# Patient Record
Sex: Male | Born: 2000 | Race: Black or African American | Hispanic: No | Marital: Single | Smoking: Never smoker
Health system: Southern US, Community
[De-identification: ages and names within clinical notes are randomized; demographics above are authoritative.]

## PROBLEM LIST (undated history)

## (undated) DIAGNOSIS — F32A Depression, unspecified: Secondary | ICD-10-CM

## (undated) DIAGNOSIS — F319 Bipolar disorder, unspecified: Secondary | ICD-10-CM

## (undated) DIAGNOSIS — F419 Anxiety disorder, unspecified: Secondary | ICD-10-CM

## (undated) DIAGNOSIS — F329 Major depressive disorder, single episode, unspecified: Secondary | ICD-10-CM

## (undated) DIAGNOSIS — M419 Scoliosis, unspecified: Secondary | ICD-10-CM

## (undated) DIAGNOSIS — F4310 Post-traumatic stress disorder, unspecified: Secondary | ICD-10-CM

## (undated) DIAGNOSIS — G43909 Migraine, unspecified, not intractable, without status migrainosus: Secondary | ICD-10-CM

## (undated) DIAGNOSIS — J45909 Unspecified asthma, uncomplicated: Secondary | ICD-10-CM

## (undated) DIAGNOSIS — R569 Unspecified convulsions: Secondary | ICD-10-CM

## (undated) HISTORY — PX: BACK SURGERY: SHX140

## (undated) HISTORY — PX: OTHER SURGICAL HISTORY: SHX169

---

## 2018-11-09 ENCOUNTER — Emergency Department (HOSPITAL_COMMUNITY)
Admission: EM | Admit: 2018-11-09 | Discharge: 2018-11-10 | Disposition: A | Discharge status: Other Acute Inpatient Hospital | Payer: Self-pay | Attending: Emergency Medicine | Admitting: Emergency Medicine

## 2018-11-09 ENCOUNTER — Encounter (HOSPITAL_COMMUNITY): Payer: Self-pay

## 2018-11-09 ENCOUNTER — Other Ambulatory Visit: Payer: Self-pay

## 2018-11-09 DIAGNOSIS — R45850 Homicidal ideations: Secondary | ICD-10-CM | POA: Insufficient documentation

## 2018-11-09 DIAGNOSIS — F329 Major depressive disorder, single episode, unspecified: Secondary | ICD-10-CM | POA: Insufficient documentation

## 2018-11-09 DIAGNOSIS — R45851 Suicidal ideations: Secondary | ICD-10-CM | POA: Insufficient documentation

## 2018-11-09 DIAGNOSIS — F17210 Nicotine dependence, cigarettes, uncomplicated: Secondary | ICD-10-CM | POA: Insufficient documentation

## 2018-11-09 HISTORY — DX: Anxiety disorder, unspecified: F41.9

## 2018-11-09 HISTORY — DX: Unspecified asthma, uncomplicated: J45.909

## 2018-11-09 HISTORY — DX: Depression, unspecified: F32.A

## 2018-11-09 HISTORY — DX: Major depressive disorder, single episode, unspecified: F32.9

## 2018-11-09 LAB — COMPREHENSIVE METABOLIC PANEL
ALT: 16 U/L (ref 0–44)
AST: 25 U/L (ref 15–41)
Albumin: 3.9 g/dL (ref 3.5–5.0)
Alkaline Phosphatase: 84 U/L (ref 38–126)
Anion gap: 8 (ref 5–15)
BUN: 11 mg/dL (ref 6–20)
CO2: 27 mmol/L (ref 22–32)
CREATININE: 0.87 mg/dL (ref 0.61–1.24)
Calcium: 9.6 mg/dL (ref 8.9–10.3)
Chloride: 104 mmol/L (ref 98–111)
GFR calc Af Amer: >60 mL/min (ref >60)
GFR calc non Af Amer: >60 mL/min (ref >60)
Glucose, Bld: 91 mg/dL (ref 70–99)
Potassium: 3.6 mmol/L (ref 3.5–5.1)
Sodium: 139 mmol/L (ref 135–145)
Total Bilirubin: 0.4 mg/dL (ref 0.3–1.2)
Total Protein: 8.2 g/dL — ABNORMAL HIGH (ref 6.5–8.1)

## 2018-11-09 LAB — CBC
HCT: 43.1 % (ref 39.0–52.0)
Hemoglobin: 13.8 g/dL (ref 13.0–17.0)
MCH: 28 pg (ref 26.0–34.0)
MCHC: 32 g/dL (ref 30.0–36.0)
MCV: 87.4 fL (ref 80.0–100.0)
Platelets: 310 10*3/uL (ref 150–400)
RBC: 4.93 MIL/uL (ref 4.22–5.81)
RDW: 13.9 % (ref 11.5–15.5)
WBC: 8.1 10*3/uL (ref 4.0–10.5)
nRBC: 0 % (ref 0.0–0.2)

## 2018-11-09 LAB — ACETAMINOPHEN LEVEL: Acetaminophen (Tylenol), Serum: <10 ug/mL — ABNORMAL LOW (ref 10–30)

## 2018-11-09 LAB — SALICYLATE LEVEL: Salicylate Lvl: <7 mg/dL (ref 2.8–30.0)

## 2018-11-09 LAB — ETHANOL: Alcohol, Ethyl (B): <10 mg/dL (ref <10)
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
## 2018-11-10 ENCOUNTER — Other Ambulatory Visit: Payer: Self-pay

## 2018-11-10 ENCOUNTER — Inpatient Hospital Stay (HOSPITAL_COMMUNITY)
Admission: AD | Admit: 2018-11-10 | Discharge: 2018-11-12 | DRG: 885 | Disposition: A | Discharge status: Home / Self Care | Payer: Federal, State, Local not specified - Other | Source: Intra-hospital | Attending: Psychiatry | Admitting: Psychiatry

## 2018-11-10 ENCOUNTER — Encounter (HOSPITAL_COMMUNITY): Payer: Self-pay | Admitting: Emergency Medicine

## 2018-11-10 DIAGNOSIS — Z590 Homelessness: Secondary | ICD-10-CM

## 2018-11-10 DIAGNOSIS — F4310 Post-traumatic stress disorder, unspecified: Secondary | ICD-10-CM | POA: Diagnosis present

## 2018-11-10 DIAGNOSIS — R45851 Suicidal ideations: Secondary | ICD-10-CM | POA: Diagnosis present

## 2018-11-10 DIAGNOSIS — F1290 Cannabis use, unspecified, uncomplicated: Secondary | ICD-10-CM | POA: Diagnosis present

## 2018-11-10 DIAGNOSIS — Z79899 Other long term (current) drug therapy: Secondary | ICD-10-CM | POA: Diagnosis not present

## 2018-11-10 DIAGNOSIS — F649 Gender identity disorder, unspecified: Secondary | ICD-10-CM | POA: Diagnosis present

## 2018-11-10 DIAGNOSIS — F1590 Other stimulant use, unspecified, uncomplicated: Secondary | ICD-10-CM | POA: Diagnosis present

## 2018-11-10 DIAGNOSIS — Z915 Personal history of self-harm: Secondary | ICD-10-CM

## 2018-11-10 DIAGNOSIS — F332 Major depressive disorder, recurrent severe without psychotic features: Principal | ICD-10-CM | POA: Diagnosis present

## 2018-11-10 DIAGNOSIS — F603 Borderline personality disorder: Secondary | ICD-10-CM | POA: Diagnosis present

## 2018-11-10 DIAGNOSIS — G4700 Insomnia, unspecified: Secondary | ICD-10-CM | POA: Diagnosis present

## 2018-11-10 DIAGNOSIS — F17210 Nicotine dependence, cigarettes, uncomplicated: Secondary | ICD-10-CM | POA: Diagnosis present

## 2018-11-10 LAB — RAPID URINE DRUG SCREEN, HOSP PERFORMED
Amphetamines: NOT DETECTED
Barbiturates: NOT DETECTED
Benzodiazepines: NOT DETECTED
Cocaine: NOT DETECTED
Opiates: NOT DETECTED
Tetrahydrocannabinol: NOT DETECTED

## 2018-11-10 MED ORDER — VITAMIN B-1 100 MG PO TABS: 100.0000 mg | ORAL_TABLET | Freq: Every day | ORAL | Status: DC

## 2018-11-10 MED ORDER — ESTRADIOL 1 MG PO TABS
2.0000 mg | ORAL_TABLET | Freq: Three times a day (TID) | ORAL | Status: DC
  Administered 2018-11-10 – 2018-11-12 (×7): 2 mg via ORAL
  Filled 2018-11-10 (×5): qty 2
  Filled 2018-11-10: qty 30
  Filled 2018-11-10: qty 2
  Filled 2018-11-10 (×2): qty 30
  Filled 2018-11-10 (×6): qty 2

## 2018-11-10 MED ORDER — LOPERAMIDE HCL 2 MG PO CAPS: 2.0000 mg | ORAL_CAPSULE | ORAL | Status: DC | PRN

## 2018-11-10 MED ORDER — ARIPIPRAZOLE 10 MG PO TABS
10.0000 mg | ORAL_TABLET | Freq: Every day | ORAL | Status: DC
  Administered 2018-11-11: 10 mg via ORAL
  Filled 2018-11-10 (×3): qty 1
  Filled 2018-11-10: qty 7

## 2018-11-10 MED ORDER — NICOTINE 21 MG/24HR TD PT24: 21.0000 mg | MEDICATED_PATCH | Freq: Every day | TRANSDERMAL | Status: DC

## 2018-11-10 MED ORDER — ACETAMINOPHEN 325 MG PO TABS
650.0000 mg | ORAL_TABLET | Freq: Four times a day (QID) | ORAL | Status: DC | PRN
  Administered 2018-11-10: 650 mg via ORAL
  Filled 2018-11-10: qty 2

## 2018-11-10 MED ORDER — SPIRONOLACTONE 12.5 MG HALF TABLET
12.5000 mg | ORAL_TABLET | Freq: Two times a day (BID) | ORAL | Status: DC
  Filled 2018-11-10 (×2): qty 1

## 2018-11-10 MED ORDER — THIAMINE HCL 100 MG/ML IJ SOLN: 100.0000 mg | Freq: Every day | INTRAMUSCULAR | Status: DC

## 2018-11-10 MED ORDER — MAGNESIUM HYDROXIDE 400 MG/5ML PO SUSP: 30.0000 mL | Freq: Every day | ORAL | Status: DC | PRN

## 2018-11-10 MED ORDER — SPIRONOLACTONE 100 MG PO TABS
50.0000 mg | ORAL_TABLET | Freq: Two times a day (BID) | ORAL | Status: DC
  Administered 2018-11-10 – 2018-11-12 (×4): 50 mg via ORAL
  Filled 2018-11-10 (×6): qty 2
  Filled 2018-11-10: qty 7
  Filled 2018-11-10: qty 2
  Filled 2018-11-10: qty 7

## 2018-11-10 MED ORDER — HYDROXYZINE HCL 25 MG PO TABS
25.0000 mg | ORAL_TABLET | Freq: Three times a day (TID) | ORAL | Status: DC | PRN
  Administered 2018-11-10 – 2018-11-11 (×2): 25 mg via ORAL
  Filled 2018-11-10: qty 10
  Filled 2018-11-10: qty 1

## 2018-11-10 MED ORDER — TRAZODONE HCL 50 MG PO TABS
50.0000 mg | ORAL_TABLET | Freq: Every evening | ORAL | Status: DC | PRN
  Administered 2018-11-11: 50 mg via ORAL
  Filled 2018-11-10: qty 1
  Filled 2018-11-10: qty 7

## 2018-11-10 MED ORDER — LORAZEPAM 1 MG PO TABS: 0.0000 mg | ORAL_TABLET | Freq: Two times a day (BID) | ORAL | Status: DC

## 2018-11-10 MED ORDER — ACETAMINOPHEN 325 MG PO TABS: 650.0000 mg | ORAL_TABLET | ORAL | Status: DC | PRN

## 2018-11-10 MED ORDER — LORAZEPAM 1 MG PO TABS: 0.0000 mg | ORAL_TABLET | Freq: Four times a day (QID) | ORAL | Status: DC

## 2018-11-10 MED ORDER — ADULT MULTIVITAMIN W/MINERALS CH
1.0000 | ORAL_TABLET | Freq: Every day | ORAL | Status: DC
  Administered 2018-11-10: 1 via ORAL
  Filled 2018-11-10: qty 7
  Filled 2018-11-10 (×4): qty 1

## 2018-11-10 MED ORDER — LORAZEPAM 2 MG/ML IJ SOLN: 0.0000 mg | Freq: Two times a day (BID) | INTRAMUSCULAR | Status: DC

## 2018-11-10 MED ORDER — ONDANSETRON 4 MG PO TBDP: 4.0000 mg | ORAL_TABLET | Freq: Four times a day (QID) | ORAL | Status: DC | PRN

## 2018-11-10 MED ORDER — HYDROXYZINE HCL 25 MG PO TABS
25.0000 mg | ORAL_TABLET | Freq: Four times a day (QID) | ORAL | Status: DC | PRN
  Filled 2018-11-10: qty 1

## 2018-11-10 MED ORDER — ARIPIPRAZOLE 5 MG PO TABS
5.0000 mg | ORAL_TABLET | Freq: Once | ORAL | Status: AC
  Administered 2018-11-10: 5 mg via ORAL
  Filled 2018-11-10 (×2): qty 1

## 2018-11-10 MED ORDER — SERTRALINE HCL 50 MG PO TABS
50.0000 mg | ORAL_TABLET | Freq: Every day | ORAL | Status: DC
  Administered 2018-11-10 – 2018-11-12 (×3): 50 mg via ORAL
  Filled 2018-11-10 (×4): qty 1
  Filled 2018-11-10: qty 7
  Filled 2018-11-10: qty 1

## 2018-11-10 MED ORDER — LORAZEPAM 2 MG/ML IJ SOLN: 0.0000 mg | Freq: Four times a day (QID) | INTRAMUSCULAR | Status: DC

## 2018-11-10 MED ORDER — ZOLPIDEM TARTRATE 5 MG PO TABS: 5.0000 mg | ORAL_TABLET | Freq: Every evening | ORAL | Status: DC | PRN

## 2018-11-10 MED ORDER — ALUM & MAG HYDROXIDE-SIMETH 200-200-20 MG/5ML PO SUSP: 30.0000 mL | ORAL | Status: DC | PRN

## 2018-11-10 MED ORDER — LORAZEPAM 1 MG PO TABS: 1.0000 mg | ORAL_TABLET | Freq: Four times a day (QID) | ORAL | Status: DC | PRN

## 2018-11-10 MED ORDER — ESTRADIOL 1 MG PO TABS
1.0000 mg | ORAL_TABLET | Freq: Two times a day (BID) | ORAL | Status: DC
  Filled 2018-11-10 (×2): qty 1

## 2018-11-10 NOTE — [facility] Group Notes (Signed)
LCSW Group Therapy Note  11/10/2018    10:00-11:00am   Type of Therapy and Topic:  Group Therapy: Shame and Its Impact   Participation Level:  Active   Description of Group:   In this group, patients shared and discussed that guilt is the negative feeling we have when we've done something wrong, while shame is the negative feeling we have simply about "being."  In listening to each other share, patients learned that humans are all imperfect and that there is no shame in this.  We discussed how it could positively impact our wellbeing by accepting our faults as part of our being that can be worked on but does not have to shame [place].    Therapeutic Goals: 1. Patients will learn the difference between guilt and shame. 2. Patients will share their current shame feelings and how this has impacted their current lives. 3. Patients will explore possible ways to think differently about those parts of their bodies, feelings, and lives about which they do have shame. 4. Patients will learn that shame is universal, and that keeping our shame a secret actually increases its hold on [place].  Summary of Patient Progress:  The patient shared that she feels shame about not being able to make other people no matter how hard she tries or what she does.  This is of concern because in truth she realizes she needs to work on making herself happy and will never succeed with her current focus on others.  Therapeutic Modalities:   Cognitive Behavioral Therapy Motivation Interviewing  Lynnell Chad  .
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
## 2018-11-10 NOTE — ED Notes (Signed)
Pt moved to [facility] room. TTS device at bedside, assessment underway.

## 2018-11-10 NOTE — Progress Notes (Signed)
Patient did not attend AA group meeting. 

## 2018-11-10 NOTE — Tx Team (Signed)
Initial Treatment Plan 11/10/2018 3:06 PM Zachary Mcclain FMB:[id number]    PATIENT STRESSORS: Loss of significant relationship Medication change or noncompliance   PATIENT STRENGTHS: Average or above average intelligence Communication skills Motivation for treatment/growth Supportive family/friends   PATIENT IDENTIFIED PROBLEMS: "Money"  "Everything - Life"  "Anxiety"  "self-esteem"  "self-care"             DISCHARGE CRITERIA:  Improved stabilization in mood, thinking, and/or behavior Need for constant or close observation no longer present Reduction of life-threatening or endangering symptoms to within safe limits Verbal commitment to aftercare and medication compliance  PRELIMINARY DISCHARGE PLAN: Outpatient therapy Return to previous living arrangement Return to previous work or school arrangements  PATIENT/FAMILY INVOLVEMENT: This treatment plan has been presented to and reviewed with the patient, Zachary Mcclain, and/or family member.  The patient and family have been given the opportunity to ask questions and make suggestions.  Tania Ade, RN 11/10/2018, 3:06 PM

## 2018-11-10 NOTE — ED Notes (Signed)
Pt received breakfast tray 

## 2018-11-10 NOTE — Progress Notes (Addendum)
Pt tentatively accepted to [facility]  pending UDS to Dr. Jola Babinski, MD. Call to report [phone].   Princess Bruins, MSW, LCSW Therapeutic Triage Specialist  [phone]

## 2018-11-10 NOTE — [facility] Group Notes (Signed)
[facility] Group Notes:  (Nursing)  Date:  11/10/2018  Time:  1430 Type of Therapy:  Nurse Education  Participation Level:  Did Not Attend   Shela Nevin 11/10/2018, 4:06 PM

## 2018-11-10 NOTE — [facility] Suicide Risk Assessment (Signed)
[facility] Admission Suicide Risk Assessment   Nursing information obtained from:    Demographic factors:    Current Mental Status:    Loss Factors:    Historical Factors:    Risk Reduction Factors:     Total Time spent with patient: 30 minutes Principal Problem: Severe recurrent major depression without psychotic features (HCC) Diagnosis:  Principal Problem:   Severe recurrent major depression without psychotic features (HCC) Active Problems:   Borderline personality disorder (HCC)  Subjective Data: Patient is seen and examined.  Patient is a 18 year old male to male transgender patient who presented to the [facility] emergency room on 11/10/2018 with suicidal ideation.  The patient had apparently moved from [place] approximately a week ago to stay with her brother and also to meet her Internet boyfriend and person.  [place] and the turn of events led to the boyfriend not really inviting her to stay with him, her going with a friend to their home, and then developing suicidal ideation with the intent of overdosing on bleach.  The patient has a past psychiatric history significant for cannabis, methamphetamine and alcohol use disorders.  She has several hospitalizations in .  Her last hospitalization was in [place], and was discharged on Zoloft.  She stated that she is supposed be taking Zoloft as well as Abilify.  She stated that she had been off these medications for approximately 5 months.  She also stated that she was in transition with some of her health care issues and had not been on her hormones or testosterone blockers for several months.  Her drug screen on admission was essentially negative, and her blood alcohol was negative.  She stated she had used alcohol over a week ago, and methamphetamines approximately a week ago.  From review of the electronic medical record most of her hospitalizations were centering around substances.  She  did admit that she had a history of being bullied, loneliness and difficulty with confined spaces.  She also has been previously diagnosed with bipolar disorder and borderline personality disorder and has been treated with Seroquel and other medications.  She was admitted to the hospital for evaluation and stabilization.  Continued Clinical Symptoms:    The "Alcohol Use Disorders Identification Test", Guidelines for Use in Primary Care, Second Edition.  World Science writer [facility]). Score between 0-7:  no or low risk or alcohol related problems. Score between 8-15:  moderate risk of alcohol related problems. Score between 16-19:  high risk of alcohol related problems. Score 20 or above:  warrants further diagnostic evaluation for alcohol dependence and treatment.   CLINICAL FACTORS:   Bipolar Disorder:   Depressive phase Depression:   Aggression Anhedonia Comorbid alcohol abuse/dependence Hopelessness Impulsivity Insomnia Alcohol/Substance Abuse/Dependencies Personality Disorders:   Cluster B More than one psychiatric diagnosis Unstable or Poor Therapeutic Relationship Previous Psychiatric Diagnoses and Treatments   Musculoskeletal: Strength & Muscle Tone: within normal limits Gait & Station: normal Patient leans: N/A  Psychiatric Specialty Exam: Physical Exam  Nursing note and vitals reviewed. Constitutional: She is oriented to person, place, and time. She appears well-developed and well-nourished.  HENT:  Head: Normocephalic and atraumatic.  Respiratory: Effort normal.  Neurological: She is alert and oriented to person, place, and time.    ROS  There were no vitals taken for this visit.There is no height or weight on file to calculate BMI.  General Appearance: Casual  Eye Contact:  Good  Speech:  Pressured  Volume:  Normal  Mood:  Anxious  Affect:  Congruent  Thought Process:  Coherent and Descriptions of Associations: Tangential  Orientation:  Full (Time, Place,  and Person)  Thought Content:  Logical  Suicidal Thoughts:  No  Homicidal Thoughts:  No  Memory:  Immediate;   Fair Recent;   Fair Remote;   Fair  Judgement:  Intact  Insight:  Lacking  Psychomotor Activity:  Increased  Concentration:  Concentration: Fair and Attention Span: Fair  Recall:  [employer] of Knowledge:  Fair  Language:  Fair  Akathisia:  Negative  Handed:  Right  AIMS (if indicated):     Assets:  Communication Skills Desire for Improvement Physical Health Resilience  ADL's:  Intact  Cognition:  WNL  Sleep:         COGNITIVE FEATURES THAT CONTRIBUTE TO RISK:  None    SUICIDE RISK:   Minimal: No identifiable suicidal ideation.  Patients presenting with no risk factors but with morbid ruminations; Delahunty be classified as minimal risk based on the severity of the depressive symptoms  PLAN OF CARE: Patient is seen and examined.  Patient is a 18 year old male to male transgender person who presented to the emergency department with suicidal ideation after difficult interactions with her boyfriend who lives in [place].  She had transported from [place] to be with him on Valentine's Day.  This did not work out.  She was in the home of "a friend" and then attempted to drink Clorox.  They asked her to leave and took her to the emergency room because the chaos.  She was admitted to the hospital for evaluation and stabilization.  She will be admitted to the hospital.  Should be integrated into the milieu.  She will be encouraged to attend groups.  She will be restarted on the Abilify, but has been off it for 5 months so we will start at 10 mg p.o. nightly.  We will also restart the Zoloft at 50 mg p.o. daily.  She is also been off her hormones and testosterone blockers.  She will be restarted on her estradiol, but because of her low weight I am going to restart her spironolactone at a lower dose to make sure we do not lead to any dehydration.  Her drug screen on admission  was negative, her blood alcohol on admission was negative.  I certify that inpatient services furnished can reasonably be expected to improve the patient's condition.   Antonieta Pert, MD 11/10/2018, 11:57 AM

## 2018-11-10 NOTE — ED Notes (Signed)
Per [place], [facility], pt has been accepted to [facility].

## 2018-11-10 NOTE — Progress Notes (Signed)
Pt meets criteria for inpt treatment. [facility] to review for possible admission. EDP Pollina, Canary Brim, MD and pt's nurse Si Raider, RN have been advised of the disposition.  Princess Bruins, MSW, LCSW Therapeutic Triage Specialist  [phone]

## 2018-11-10 NOTE — Progress Notes (Signed)
Pt is transgender Male to male.  Pt wearing long wig and is in bed.  Pt prefers to be called Zachary Mcclain.  Pt denies SI, HI and AVH and verbally contracts for safety.  Pt does not come to med window for meds after being notified that they are due.  Pt denies pain or discomfort. Pt given support and encouragement. Pt remains safe on unit.

## 2018-11-10 NOTE — ED Notes (Signed)
Patient being transferred to [facility]

## 2018-11-10 NOTE — Progress Notes (Signed)
Patient ID: Georgeanna Lea Haston, adult   DOB: Jan 21, 2001, 18 y.o.   MRN: [id number]  D: Pt alert and oriented during [facility] admission process. Pt denies SI/HI, A/VH, and any pain. Pt is cooperative.  "Kartel Domingo Pulse Bocchino is an 18 y.o. male who presents to the ED voluntarily. Pt is transgender, male to male. Pt states she moved here from [place] about 1 week ago to live with her brother. Pt states her brother is having conflict in his home, therefore she has been staying with a friend until she is able to move with her brother. Pt states while she was with her friend, she received a phone call from her ex-boyfriend that triggered her. Pt states they began to argue and she became upset. Pt states she attempted to grab a bottle of bleach from under the sink at her friends home however they stopped her and told her that she was scaring the kids in the home and she had to leave. Pt states she felt hopeless and came to the ED. Pt states she has been doing research on ways to kill herself and what would happen to her body if she drank bleach."   A: Education, support, reassurance, and encouragement provided, q15 minute safety checks initiated. Pt's belongings in locker # 25.    R: Pt denies any concerns at this time, and verbally contracts for safety. Pt ambulating on the unit with no issues. Pt remains safe on and off the unit.
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
## 2018-11-10 NOTE — ED Notes (Signed)
Signature pad unavailable.  

## 2018-11-10 NOTE — ED Notes (Signed)
Belongings in 9 and 10 in purple pod-3 bags

## 2018-11-10 NOTE — ED Notes (Signed)
Pt. Resting comfortably. 

## 2018-11-11 LAB — TSH: TSH: 0.425 u[IU]/mL (ref 0.350–4.500)
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
## 2018-11-11 NOTE — Progress Notes (Signed)
[facility] MD Progress Note  11/11/2018 9:59 AM Rankin Domingo Pulse [place]  MRN:  [id number]   Subjective: denies depression and suicidal ideations, 7/10 anxiety    18 year old patient admitted for passive suicidal attempt, trying to ingest chlorine bleach.  HPI: On evaluation today, patient is in her room.  She is alert and oriented x4 and responds appropriately when greeted by this Clinical research associate.  She reports sleeping well and "feeling better today".  He denies suicidal or homicidal ideations and does not appear to be responding to internal stimulus.    Denies depression 0/10; but does endorse anxiety rated 7/10 (where 10/10 is severely anxious).  Reports anxiety because, "my family and boyfriend think I am dead".  However, he was able to speak with his brother who is aware he is safe.  He left a voicemail for his boyfriend.  Overall states his anxiety is beginning to improve since he was able to reach out to loved ones.    He would like to be discharged but voices some ambiguity regarding where he will live.  He spoke with his brother and thinks he Pfefferkorn be able to return to his home but unsure.  He would like to speak with social work to explore local residential options.      Principal Problem: Severe recurrent major depression without psychotic features (HCC) Diagnosis: Principal Problem:   Severe recurrent major depression without psychotic features (HCC) Active Problems:   Borderline personality disorder (HCC)  Total Time spent with patient: 30 minutes  Past Psychiatric History: depression, substance abuse, cluster B traits  Past Medical History:  Past Medical History:  Diagnosis Date  . Anxiety   . Asthma   . Depression     Past Surgical History:  Procedure Laterality Date  . lower back      Family History: History reviewed. No pertinent family history. Family Psychiatric  History: mother with substance abuse per patient Social History:  Social History   Substance and Sexual Activity  Alcohol  Use Yes     Social History   Substance and Sexual Activity  Drug Use Yes  . Types: Methamphetamines, Marijuana   Comment: presciption pills     Social History   Socioeconomic History  . Marital status: Single    Spouse name: Not on file  . Number of children: Not on file  . Years of education: Not on file  . Highest education level: Not on file  Occupational History  . Not on file  Social Needs  . Financial resource strain: Not on file  . Food insecurity:    Worry: Not on file    Inability: Not on file  . Transportation needs:    Medical: Not on file    Non-medical: Not on file  Tobacco Use  . Smoking status: Current Every Day Smoker    Packs/day: 1.00  . Smokeless tobacco: Current User  Substance and Sexual Activity  . Alcohol use: Yes  . Drug use: Yes    Types: Methamphetamines, Marijuana    Comment: presciption pills   . Sexual activity: Not on file  Lifestyle  . Physical activity:    Days per week: Not on file    Minutes per session: Not on file  . Stress: Not on file  Relationships  . Social connections:    Talks on phone: Not on file    Gets together: Not on file    Attends religious service: Not on file    Active member of club  or organization: Not on file    Attends meetings of clubs or organizations: Not on file    Relationship status: Not on file  Other Topics Concern  . Not on file  Social History Narrative  . Not on file   Additional Social History:   Sleep: Good  Appetite:  Good  Current Medications: Current Facility-Administered Medications  Medication Dose Route Frequency Provider Last Rate Last Dose  . acetaminophen (TYLENOL) tablet 650 mg  650 mg Oral Q6H PRN Nira Conn A, NP   650 mg at 11/10/18 1139  . alum & mag hydroxide-simeth (MAALOX/MYLANTA) 200-200-20 MG/5ML suspension 30 mL  30 mL Oral Q4H PRN Nira Conn A, NP      . ARIPiprazole (ABILIFY) tablet 10 mg  10 mg Oral QHS Antonieta Pert, MD      . estradiol (ESTRACE)  tablet 2 mg  2 mg Oral TID Malvin Johns, MD   2 mg at 11/11/18 0848  . hydrOXYzine (ATARAX/VISTARIL) tablet 25 mg  25 mg Oral TID PRN Nira Conn A, NP   25 mg at 11/10/18 1139  . loperamide (IMODIUM) capsule 2-4 mg  2-4 mg Oral PRN Nira Conn A, NP      . LORazepam (ATIVAN) tablet 1 mg  1 mg Oral Q6H PRN Nira Conn A, NP      . magnesium hydroxide (MILK OF MAGNESIA) suspension 30 mL  30 mL Oral Daily PRN Nira Conn A, NP      . multivitamin with minerals tablet 1 tablet  1 tablet Oral Daily Nira Conn A, NP   1 tablet at 11/10/18 1140  . ondansetron (ZOFRAN-ODT) disintegrating tablet 4 mg  4 mg Oral Q6H PRN Nira Conn A, NP      . sertraline (ZOLOFT) tablet 50 mg  50 mg Oral Daily Antonieta Pert, MD   50 mg at 11/11/18 0848  . spironolactone (ALDACTONE) tablet 50 mg  50 mg Oral BID Malvin Johns, MD   50 mg at 11/11/18 0848  . traZODone (DESYREL) tablet 50 mg  50 mg Oral QHS PRN Jackelyn Poling, NP        Lab Results:  Results for orders placed or performed during the hospital encounter of 11/10/18 (from the past 48 hour(s))  TSH     Status: None   Collection Time: 11/11/18  6:46 AM  Result Value Ref Range   TSH 0.425 0.350 - 4.500 uIU/mL    Comment: Performed by a 3rd Generation assay with a functional sensitivity of <=0.01 uIU/mL. Performed at [facility], [address], [place], [place] [address]     Blood Alcohol level:  Lab Results  Component Value Date   ETH <10 11/09/2018    Metabolic Disorder Labs: No results found for: HGBA1C, MPG No results found for: PROLACTIN No results found for: CHOL, TRIG, HDL, CHOLHDL, VLDL, LDLCALC  Physical Findings: AIMS:  , ,  ,  ,    CIWA:  CIWA-Ar Total: 4 COWS:     Musculoskeletal: Strength & Muscle Tone: within normal limits Gait & Station: normal Patient leans: N/A  Psychiatric Specialty Exam: Physical Exam  Nursing note and vitals reviewed. Constitutional: She is oriented to person, place, and  time. She appears well-developed and well-nourished.  HENT:  Head: Normocephalic.  Neck: Normal range of motion.  Respiratory: Effort normal.  Musculoskeletal: Normal range of motion.  Neurological: She is alert and oriented to person, place, and time.  Psychiatric: Her speech is normal and  behavior is normal. Thought content normal. Her mood appears anxious. Cognition and memory are normal. She expresses impulsivity.    Review of Systems  Psychiatric/Behavioral: Positive for substance abuse. The patient is nervous/anxious.   All other systems reviewed and are negative.   Blood pressure 134/67, pulse 92, temperature 98.4 F (36.9 C), temperature source Oral, resp. rate 17, height 5\' 5"  (1.651 m), weight 53.1 kg.Body mass index is 19.47 kg/m.  General Appearance: Casual  Eye Contact:  Good  Speech:  Normal Rate  Volume:  Normal  Mood:  Anxious and Euphoric  Affect:  Congruent  Thought Process:  Coherent and Descriptions of Associations: Intact  Orientation:  Full (Time, Place, and Person)  Thought Content:  Rumination  Suicidal Thoughts:  No  Homicidal Thoughts:  No  Memory:  Immediate;   Fair Recent;   Fair Remote;   Fair  Judgement:  Fair  Insight:  Fair  Psychomotor Activity:  Normal  Concentration:  Concentration: Good and Attention Span: Good  Recall:  Good  Fund of Knowledge:  Good  Language:  Good  Akathisia:  No  Handed:  Right  AIMS (if indicated):     Assets:  Leisure Time Physical Health Resilience  ADL's:  Intact  Cognition:  WNL  Sleep:  Number of Hours: 6.75   Treatment Plan Summary: Daily contact with patient to assess and evaluate symptoms and progress in treatment, Medication management and Plan major depessive disorder, recurrent, moderate:  -Continued Zoloft 50 mg daily -Continued Abilify 10 mg at bedtime  Insomnia: -Restarted Trazodone 50 mg at bedtime  Anxiety: -Started hydroxyzine 25 mg TID PRN anxiety  Transgender process: -Restarted  estradiol 2 mg TID -Restarted spironolactone 50 mg BID  Safety: Will continue 15 minute observation for safety checks. Patient is able to contract for safety on the unit at this time  Labs: Chem WDL except total protein of 8.2H, CBC WDL, negative for acetaminophen and salicylate and alcohol and drugs.  Continue to develop treatment plan to decrease risk of relapse upon discharge and to reduce the need for readmission.  Psycho-social education regarding relapse prevention and self care.  Health care follow up as needed for medical problems.  Continue to attend and participate in therapy.  Discharge planned for tomorrow, 11/11/18  Nanine MeansLORD, Trestan Vahle, NP 11/11/2018, 9:59 AM

## 2018-11-11 NOTE — Progress Notes (Signed)
Pt did not attend AA meeting provided with encouragement. Pt appears animated/anxious in affect and mood. Pt denies SI/HI/AVH/Pain at this time. Pt stays isolative to room. Pt is minimal with interaction but brightens on approach. No new c/o's. Support offered. Will continue with POC.

## 2018-11-11 NOTE — [facility] Group Notes (Signed)
[facility] LCSW Group Therapy Note  11/11/2018  10:00-11:00AM  Type of Therapy and Topic:  Group Therapy:  Adding Supports Including Being Your Own Support  Participation Level:  Active   Description of Group:  Patients in this group were introduced to the concept that additional supports including self-support are an essential part of recovery.  A song entitled "I Need Help!" was played and a group discussion was held in reaction to the idea of needing to add supports.  A song entitled "My Own Hero" was played and a group discussion ensued in which patients stated they could relate to the song and it inspired them to realize they have be willing to help themselves in order to succeed, because other people cannot achieve sobriety or stability for them.  We discussed adding a variety of healthy supports to address the various needs in their lives.  A song was played called "I Know Where I've Been" toward the end of group and used to conduct an inspirational wrap-up to group of remembering how far they have already come in their journey.  Therapeutic Goals: 1)  demonstrate the importance of being a part of one's own support system 2)  discuss reasons people in one's life [place] eventually be unable to be continually supportive  3)  identify the patient's current support system and   4)  elicit commitments to add healthy supports and to become more conscious of being self-supportive   Summary of Patient Progress:  The patient expressed that her 17yo brother is her healthy support, even though she has not seen him in 12 years.  She has moved to this area to be closer to him and anticipates seeing him often once he turns 20yo.  She stated her mother is an unhealthy support for her because their form of "bonding" in the past was to use drugs together, and she believes that her mother is still in active addiction.  She enjoys making lists of positive thoughts for the day, and said that she believes a job she really  enjoys and likes will be a healthy additional support in her life.   Therapeutic Modalities:   Motivational Interviewing Activity  Lynnell Chad

## 2018-11-11 NOTE — [facility] Suicide Risk Assessment (Signed)
[facility] INPATIENT:  Family/Significant Other Suicide Prevention Education  Suicide Prevention Education:  Patient Refusal for Family/Significant Other Suicide Prevention Education: The patient Zachary Mcclain has refused to provide written consent for family/significant other to be provided Family/Significant Other Suicide Prevention Education during admission and/or prior to discharge.  Physician notified.  SPE completed with pt, as pt refused to consent to family contact. SPI pamphlet provided to pt and pt was encouraged to share information with support network, ask questions, and talk about any concerns relating to SPE. Pt denies access to guns/firearms and verbalized understanding of information provided. Mobile Crisis information also provided to pt.   Rona Ravens LCSW 11/11/2018, 11:58 AM

## 2018-11-11 NOTE — Progress Notes (Signed)
Patient did not attend the evening speaker AA meeting. Pt was notified that group was beginning but returned to the room.

## 2018-11-12 MED ORDER — ARIPIPRAZOLE 10 MG PO TABS: 10.0000 mg | ORAL_TABLET | Freq: Every day | ORAL | 0 refills | Status: DC

## 2018-11-12 MED ORDER — SPIRONOLACTONE 50 MG PO TABS: 50.0000 mg | ORAL_TABLET | Freq: Two times a day (BID) | ORAL | 0 refills | Status: DC

## 2018-11-12 MED ORDER — TRAZODONE HCL 50 MG PO TABS: 50.0000 mg | ORAL_TABLET | Freq: Every evening | ORAL | 0 refills | Status: DC | PRN

## 2018-11-12 MED ORDER — HYDROXYZINE HCL 25 MG PO TABS: 25.0000 mg | ORAL_TABLET | Freq: Three times a day (TID) | ORAL | 0 refills | Status: DC | PRN

## 2018-11-12 MED ORDER — ADULT MULTIVITAMIN W/MINERALS CH: 1.0000 | ORAL_TABLET | Freq: Every day | ORAL | Status: DC

## 2018-11-12 MED ORDER — ESTRADIOL 1 MG PO TABS: 2.0000 mg | ORAL_TABLET | Freq: Three times a day (TID) | ORAL | 0 refills | Status: DC

## 2018-11-12 MED ORDER — SERTRALINE HCL 50 MG PO TABS: 50.0000 mg | ORAL_TABLET | Freq: Every day | ORAL | 0 refills | Status: DC

## 2018-11-12 NOTE — Plan of Care (Addendum)
Patient self inventory- Patient slept well last night. Appetite is good, energy level normal. Depression, hopelessness, and anxiety rated 0, 0, 3 out of 10. Denies SI HI AVH. Patient's goal is to "go home."  Patient is compliant with medications, no side effects noted. Safety is maintained with 15 minute checks as well as environmental checks. Will continue to monitor and provide support.  Problem: Education: Goal: Knowledge of  General Education information/materials will improve Outcome: Adequate for Discharge   Problem: Education: Goal: Emotional status will improve Outcome: Adequate for Discharge   Problem: Education: Goal: Mental status will improve Outcome: Adequate for Discharge   Problem: Education: Goal: Verbalization of understanding the information provided will improve Outcome: Adequate for Discharge   Problem: Activity: Goal: Interest or engagement in activities will improve Outcome: Adequate for Discharge   Problem: Activity: Goal: Sleeping patterns will improve Outcome: Adequate for Discharge

## 2018-11-12 NOTE — Progress Notes (Signed)
Recreation Therapy Notes  Date:  2.17.20 Time: 0930 Location: [address] Dayroom  Group Topic: Stress Management  Goal Area(s) Addresses:  Patient will identify positive stress management techniques. Patient will identify benefits of using stress management post d/c.  Intervention:  Stress Management  Activity :  Meditation.  LRT introduced the stress management technique of meditation.  LRT played a meditation that focused on impermanence.  Patients were to listen and follow as meditation played to engaged in the activity.   Education:  Stress Management, Discharge Planning.   Education Outcome: Acknowledges Education  Clinical Observations/Feedback: Pt did not attend group.     Caroll Rancher, LRT/CTRS         Lillia Abed, Lido Maske A 11/12/2018 11:09 AM

## 2018-11-12 NOTE — Progress Notes (Signed)
  [facility] Adult Case Management Discharge Plan :  Will you be returning to the same living situation after discharge:  No. pt [facility]. Bed available for tonight.  At discharge, do you have transportation home?: Yes,  bus pass provided per pt request.  Do you have the ability to pay for your medications: Yes,  mental health  Release of information consent forms completed and submitted to medical records by CSW.   Patient to Follow up at: Follow-up Information    [facility] Follow up on 11/14/2018.   Specialty:  Behavioral Health Why:  Hospital follow up appointment is Wednesday, 2/19 at 8:00a. Please bring your photo ID, proof of insurance, current medications and discharge paperwork from this hospitalization.  Contact information: [address] [place] [address] [phone]           Next level of care provider has access to [facility] Link:no  Safety Planning and Suicide Prevention discussed: Yes,  SPE completed with pt; pt declined to consent to collateral contact. SPI pamphlet and mobile crisis information provided to pt.     Has patient been referred to the Quitline?: Patient refused referral  Patient has been referred for addiction treatment: Yes  Rona Ravens, LCSW 11/12/2018, 11:23 AM

## 2018-11-12 NOTE — [facility] Suicide Risk Assessment (Signed)
[facility] Discharge Suicide Risk Assessment   Principal Problem: Severe recurrent major depression without psychotic features [facility]) Discharge Diagnoses: Principal Problem:   Severe recurrent major depression without psychotic features (HCC) Active Problems:   Borderline personality disorder (HCC)   Total Time spent with patient: 15 minutes  Musculoskeletal: Strength & Muscle Tone: within normal limits Gait & Station: normal Patient leans: N/A  Psychiatric Specialty Exam: Review of Systems  All other systems reviewed and are negative.   Blood pressure 125/86, pulse 76, temperature 98.4 F (36.9 C), temperature source Oral, resp. rate 17, height 5\' 5"  (1.651 m), weight 53.1 kg.Body mass index is 19.47 kg/m.  General Appearance: Casual  Eye Contact::  Good  Speech:  Normal Rate409  Volume:  Normal  Mood:  Euthymic  Affect:  Congruent  Thought Process:  Coherent and Descriptions of Associations: Intact  Orientation:  Full (Time, Place, and Person)  Thought Content:  Logical  Suicidal Thoughts:  No  Homicidal Thoughts:  No  Memory:  Immediate;   Fair Recent;   Fair Remote;   Fair  Judgement:  Intact  Insight:  Fair  Psychomotor Activity:  Normal  Concentration:  Fair  Recall:  [employer] of Knowledge:Fair  Language: Good  Akathisia:  Negative  Handed:  Right  AIMS (if indicated):     Assets:  Communication Skills Desire for Improvement Physical Health Resilience  Sleep:  Number of Hours: 6  Cognition: WNL  ADL's:  Intact   Mental Status Per Nursing Assessment::   On Admission:  Suicidal ideation indicated by patient, Self-harm thoughts  Demographic Factors:  Adolescent or young adult, Cardell Peach, lesbian, or bisexual orientation, Low socioeconomic status, Living alone and Unemployed  Loss Factors: Loss of significant relationship  Historical Factors: Impulsivity  Risk Reduction Factors:   Positive coping skills or problem solving skills  Continued Clinical  Symptoms:  Depression:   Impulsivity Personality Disorders:   Cluster B  Cognitive Features That Contribute To Risk:  None    Suicide Risk:  Minimal: No identifiable suicidal ideation.  Patients presenting with no risk factors but with morbid ruminations; [place] be classified as minimal risk based on the severity of the depressive symptoms  Follow-up Information    [facility] Follow up.   Specialty:  [facility] information: [address] [place] [address] [phone]           Plan Of Care/Follow-up recommendations:  Activity:  ad lib  Antonieta Pert, MD 11/12/2018, 10:05 AM

## 2018-11-12 NOTE — Discharge Summary (Signed)
Physician Discharge Summary Note  Zachary Mcclain:  Zachary LeaSymar Rakeeb Mcclain is an 18 y.o., adult  MRN:  [id number]  DOB:  06/16/2001  Zachary Mcclain phone:  [phone] (home)   Zachary Mcclain address:   [address] [place] [id number],   Total Time spent with Zachary Mcclain: Greater than 30 minutes  Date of Admission:  11/10/2018  Date of Discharge: 11-12-18  Reason for Admission: Suicidal ideations with plan to overdose.  Principal Problem: Severe recurrent major depression without psychotic features [facility](HCC)  Discharge Diagnoses: Principal Problem:   Severe recurrent major depression without psychotic features (HCC) Active Problems:   Borderline personality disorder (HCC)  Past Psychiatric History: MDD, Borderline personality disorder.  Past Medical History:  Past Medical History:  Diagnosis Date  . Anxiety   . Asthma   . Depression     Past Surgical History:  Procedure Laterality Date  . lower back      Family History: History reviewed. No pertinent family history.  Family Psychiatric  History: See H&P  Social History:  Social History   Substance and Sexual Activity  Alcohol Use Yes     Social History   Substance and Sexual Activity  Drug Use Yes  . Types: Methamphetamines, Marijuana   Comment: presciption pills     Social History   Socioeconomic History  . Marital status: Single    Spouse name: Not on file  . Number of children: Not on file  . Years of education: Not on file  . Highest education level: Not on file  Occupational History  . Not on file  Social Needs  . Financial resource strain: Not on file  . Food insecurity:    Worry: Not on file    Inability: Not on file  . Transportation needs:    Medical: Not on file    Non-medical: Not on file  Tobacco Use  . Smoking status: Current Every Day Smoker    Packs/day: 1.00  . Smokeless tobacco: Current User  Substance and Sexual Activity  . Alcohol use: Yes  . Drug use: Yes    Types: Methamphetamines, Marijuana     Comment: presciption pills   . Sexual activity: Not on file  Lifestyle  . Physical activity:    Days per week: Not on file    Minutes per session: Not on file  . Stress: Not on file  Relationships  . Social connections:    Talks on phone: Not on file    Gets together: Not on file    Attends religious service: Not on file    Active member of club or organization: Not on file    Attends meetings of clubs or organizations: Not on file    Relationship status: Not on file  Other Topics Concern  . Not on file  Social History Narrative  . Not on file   Hospital Course: (Per Md's admission SRA): Zachary Mcclain is a 18 year old male to male transgender Zachary Mcclain who [facility] emergency room on 11/10/2018 with suicidal ideation. [place]cl[place] had apparently [place] from [place] week ago to stay with Zachary Mcclain brother and also to meet Zachary Mcclain Internet boyfriend and person. [place], and [place] turn of events led to Zachary boyfriend not really inviting Zachary Mcclain to stay with him, Zachary Mcclain going with a friend to their home, and then developing suicidal ideation with Zachary intent of overdosing on bleach. Zachary Mcclain has a past psychiatric history significant for cannabis, methamphetamine and alcohol  use disorders.  Zachary Mcclain has several hospitalizations in [place].  Zachary Mcclain last hospitalization was in [place], and was discharged on Zoloft.  Zachary Mcclain Mcclain that Zachary Mcclain is supposed be taking Zoloft as well as Abilify.  Zachary Mcclain Mcclain that Zachary Mcclain had been off these medications for approximately 5 months.  Zachary Mcclain also Mcclain that Zachary Mcclain was in transition with some of Zachary Mcclain health care issues and had not been on Zachary Mcclain hormones or testosterone blockers for several months.  Zachary Mcclain drug screen on admission was essentially negative, and Zachary Mcclain blood alcohol was negative.  Zachary Mcclain Mcclain Zachary Mcclain had used alcohol over a week ago, and methamphetamines approximately a week ago.  From review of Zachary electronic medical record  most of Zachary Mcclain hospitalizations were centering around substances.  Zachary Mcclain did admit that Zachary Mcclain had a history of being bullied, loneliness and difficulty with confined spaces.  Zachary Mcclain also has been previously diagnosed with bipolar disorder and borderline personality disorder and has been treated with Seroquel and other medications.  Zachary Mcclain was admitted to Zachary hospital for evaluation and stabilization.  After Zachary above admission assessment, Zachary Mcclain was recommended for mood stabilization treatments. Although new in this [place] area, Zachary Mcclain has previous hx of mental illness including substance use disorders. Zachary Mcclain has been hospitalized in a psychiatric hospital in [place] past possibly in [place] area for Bipolar disorder, Borderline personality disorder & substance use disorders. Zachary Mcclain BAL on this present admission was less than 10 & UDS was negative of all substances. Zachary Mcclain received mood stabilization treatment with Zachary medications as listed below.   Zachary Mcclain symptoms responded well to Zachary Mcclain treatment regimen. This is evidenced by Zachary Mcclain reports of improved mood, absence of suicidal ideations, presentation of good/reactive affect & absence of anxiety symptoms. Zachary Mcclain is seen today by Zachary Mcclain attending psychiatrist for discharge evaluation. Zachary Mcclain that Zachary Mcclain is pleased that Zachary Mcclain sought help. Says Zachary Mcclain is tolerating Zachary Mcclain medications well. Zachary Mcclain is no longer feeling depressed. Describes normal energy and ability to think. Mcclain to focus on task. No suicidal thoughts. No homicidal thoughts. No thoughts of violence. Zachary Mcclain reports normal biological functions.    Zachary nursing staff reports that Zachary Mcclain has been appropriate on Zachary unit. Zachary Mcclain has been interacting well with peers & staff. No behavioral issues. Zachary Mcclain has not voiced any suicidal thoughts. Zachary Mcclain has not been observed to be internally stimulated or occupied. Zachary Mcclain has been adherent with Zachary treatment recommendations. Zachary Mcclain has been tolerating Zachary Mcclain medication well. Denies any adverse  reactions or side effects.    Zachary Mcclain was discussed at Zachary team meeting this morning. Team members feel that Zachary Mcclain is back to Zachary Mcclain baseline level of function. Team agrees with plan to discharge Zachary Mcclain today to continue mental health care on outpatient basis as noted below. Upon discharge, Zachary Mcclain denies any SIHI, AVH, delusional thoughts or paranoia. Zachary Mcclain will continue further psychiatric follow-up care/medication management on an outpatient basis as noted below. Zachary Mcclain was provided with all Zachary necessary information needed to make this appointment without problems. Zachary Mcclain to engage in safety planning including plan to return to [facility] or contact emergency services if Zachary Mcclain feels unable to maintain hisown safety or Zachary safety of others. Pt had no further questions, comments or concerns. Zachary Mcclain left [facility] with all personal belongings in no apparent distress.  Physical Findings: AIMS:  , ,  ,  ,    CIWA:  CIWA-Ar Total: 0 COWS:     Musculoskeletal: Strength & Muscle Tone: within normal limits Gait & Station: normal Zachary Mcclain leans: N/A  Psychiatric Specialty Exam: Physical Exam  Nursing note and vitals reviewed. Constitutional: Zachary Mcclain appears well-developed.  HENT:  Head: Normocephalic.  Eyes: Pupils are equal, round, and reactive to light.  Neck: Normal range of motion.  Respiratory: Effort normal.  GI: Soft.  Genitourinary:    Genitourinary Comments: Deferred   Musculoskeletal: Normal range of motion.  Neurological: Zachary Mcclain is alert.  Skin: Skin is warm.    Review of Systems  Constitutional: Negative.   HENT: Negative.   Eyes: Negative.   Respiratory: Negative.  Negative for cough and shortness of breath.   Cardiovascular: Negative.  Negative for chest pain and palpitations.  Gastrointestinal: Negative.  Negative for abdominal pain, heartburn, nausea and vomiting.  Genitourinary: Negative.   Musculoskeletal: Negative.   Skin: Negative.   Neurological: Negative.  Negative for dizziness  and headaches.  Endo/Heme/Allergies: Negative.   Psychiatric/Behavioral: Positive for depression (Stabilized with medication prior to discharge). Negative for hallucinations, memory loss, substance abuse and suicidal ideas. Zachary Mcclain has insomnia (Stable). Zachary Mcclain is not nervous/anxious (Stable).     Blood pressure 125/86, pulse 76, temperature 98.4 F (36.9 C), temperature source Oral, resp. rate 17, height 5\' 5"  (1.651 m), weight 53.1 kg.Body mass index is 19.47 kg/m.  See Md's discharge SRA   Has this Zachary Mcclain used any form of tobacco in Zachary last 30 days? (Cigarettes, Smokeless Tobacco, Cigars, and/or Pipes): N/A  Blood Alcohol level:  Lab Results  Component Value Date   ETH <10 11/09/2018   Metabolic Disorder Labs:  No results found for: HGBA1C, MPG No results found for: PROLACTIN No results found for: CHOL, TRIG, HDL, CHOLHDL, VLDL, LDLCALC  See Psychiatric Specialty Exam and Suicide Risk Assessment completed by Attending Physician prior to discharge.  Discharge destination:  Home  Is Zachary Mcclain on multiple antipsychotic therapies at discharge:  No   Has Zachary Mcclain had three or more failed trials of antipsychotic monotherapy by history:  No  Recommended Plan for Multiple Antipsychotic Therapies: NA  Allergies as of 11/12/2018   No Known Allergies     Medication List    TAKE these medications     Indication  ARIPiprazole 10 MG tablet Commonly known as:  ABILIFY Take 1 tablet (10 mg total) by mouth at bedtime. For mood control  Indication:  Mood control   estradiol 1 MG tablet Commonly known as:  ESTRACE Take 2 tablets (2 mg total) by mouth 3 (three) times daily. Hormonal therapy What changed:  additional instructions  Indication:  Hormonal therapy   hydrOXYzine 25 MG tablet Commonly known as:  ATARAX/VISTARIL Take 1 tablet (25 mg total) by mouth 3 (three) times daily as needed for anxiety.  Indication:  Feeling Anxious   multivitamin with minerals Tabs  tablet Take 1 tablet by mouth daily. (Wedin buy from over Zachary counter): Vitamin supplement Start taking on:  November 13, 2018  Indication:  Vitamin supplement   sertraline 50 MG tablet Commonly known as:  ZOLOFT Take 1 tablet (50 mg total) by mouth daily. For depression What changed:  additional instructions  Indication:  Major Depressive Disorder   spironolactone 50 MG tablet Commonly known as:  ALDACTONE Take 1 tablet (50 mg total) by mouth 2 (two) times daily. For transgenderism What changed:  additional instructions  Indication:  Person Born Male, Identifies as Male   traZODone 50 MG tablet Commonly known as:  DESYREL Take 1 tablet (50 mg total) by mouth at bedtime as needed for sleep.  Indication:  Trouble Sleeping  Follow-up Information    [facility] Follow up on 11/14/2018.   Specialty:  Behavioral Health Why:  Hospital follow up appointment is Wednesday, 2/19 at 8:00a. Please bring your photo ID, proof of insurance, current medications and discharge paperwork from this hospitalization.  Contact informationElpidio Eric: [address] [place] [id number] [phone]          Follow-up recommendations: Activity:  As tolerated Diet: As recommended by your primary care doctor. Keep all scheduled follow-up appointments as recommended.  Comments: Zachary Mcclain is instructed prior to discharge to: Take all medications as prescribed by Zachary Mcclain/Zachary Mcclain mental healthcare provider. Report any adverse effects and or reactions from Zachary medicines to Zachary Mcclain/Zachary Mcclain outpatient provider promptly. Zachary Mcclain has been instructed & cautioned: To not engage in alcohol and or illegal drug use while on prescription medicines. In Zachary event of worsening symptoms, Zachary Mcclain is instructed to call Zachary crisis hotline, 911 and or go to Zachary nearest ED for appropriate evaluation and treatment of symptoms. To follow-up with Zachary Mcclain/Zachary Mcclain primary care provider for your other medical issues, concerns and or health care needs.    Signed: Armandina StammerAgnes Tyleah Loh, NP, PMHNP, FNP-BC 11/12/2018, 1:05 PM

## 2018-11-12 NOTE — Progress Notes (Signed)
Pt attended spiritual care group on grief and loss facilitated by chaplain Burnis Kingfisher   Group opened with brief discussion and psycho-social ed around grief and loss in relationships and in relation to self - identifying life patterns, circumstances, changes that cause losses. Established group norm of speaking from own life experience. Group goal of establishing open and affirming space for members to share loss and experience with grief, normalize grief experience and provide psycho social education and grief support.    Zachary Mcclain was present throughout group.  Presented with guarded body language at beginning of group, but was progressively open through group and engaged in facilitated discussion voluntarily.   Discussed grieving childhood.  Identified feeling resentment around growing up too quickly.  Described having to care for her younger brother and sister. Reported filing for emancipation at age 18.  Related parents not acknowledging identity when she was younger - reported this has shifted somewhat.   Spoke about recognition of "journey" in both grief and loss and life in general and feeling some freedom in caring for herself on this journey and letting go of the idea that "I should be" in another place.   Burnis Kingfisher, MDiv, [facility]

## 2018-11-12 NOTE — Tx Team (Signed)
Interdisciplinary Treatment and Diagnostic Plan Update  11/12/2018 Time of Session: [id number] Zachary Mcclain MRN: [id number]  Principal Diagnosis: Severe recurrent major depression without psychotic features [facility])  Secondary Diagnoses: Principal Problem:   Severe recurrent major depression without psychotic features (HCC) Active Problems:   Borderline personality disorder (HCC)   Current Medications:  Current Facility-Administered Medications  Medication Dose Route Frequency Provider Last Rate Last Dose  . acetaminophen (TYLENOL) tablet 650 mg  650 mg Oral Q6H PRN Nira Conn A, NP   650 mg at 11/10/18 1139  . alum & mag hydroxide-simeth (MAALOX/MYLANTA) 200-200-20 MG/5ML suspension 30 mL  30 mL Oral Q4H PRN Nira Conn A, NP      . ARIPiprazole (ABILIFY) tablet 10 mg  10 mg Oral QHS Antonieta Pert, MD   10 mg at 11/11/18 2200  . estradiol (ESTRACE) tablet 2 mg  2 mg Oral TID Malvin Johns, MD   2 mg at 11/12/18 8841  . hydrOXYzine (ATARAX/VISTARIL) tablet 25 mg  25 mg Oral TID PRN Jackelyn Poling, NP   25 mg at 11/11/18 2200  . loperamide (IMODIUM) capsule 2-4 mg  2-4 mg Oral PRN Nira Conn A, NP      . LORazepam (ATIVAN) tablet 1 mg  1 mg Oral Q6H PRN Nira Conn A, NP      . magnesium hydroxide (MILK OF MAGNESIA) suspension 30 mL  30 mL Oral Daily PRN Nira Conn A, NP      . multivitamin with minerals tablet 1 tablet  1 tablet Oral Daily Nira Conn A, NP   1 tablet at 11/10/18 1140  . ondansetron (ZOFRAN-ODT) disintegrating tablet 4 mg  4 mg Oral Q6H PRN Nira Conn A, NP      . sertraline (ZOLOFT) tablet 50 mg  50 mg Oral Daily Antonieta Pert, MD   50 mg at 11/12/18 6606  . spironolactone (ALDACTONE) tablet 50 mg  50 mg Oral BID Malvin Johns, MD   50 mg at 11/12/18 3016  . traZODone (DESYREL) tablet 50 mg  50 mg Oral QHS PRN Nira Conn A, NP   50 mg at 11/11/18 2200   PTA Medications: Medications Prior to Admission  Medication Sig Dispense Refill Last Dose  .  estradiol (ESTRACE) 1 MG tablet Take 2 mg by mouth 3 (three) times daily.   Past Week at Unknown time  . sertraline (ZOLOFT) 50 MG tablet Take 50 mg by mouth daily.   Past Month at Unknown time  . spironolactone (ALDACTONE) 50 MG tablet Take 50 mg by mouth 2 (two) times daily.   Past Week at Unknown time    Patient Stressors: Loss of significant relationship Medication change or noncompliance  Patient Strengths: Average or above average intelligence Communication skills Motivation for treatment/growth Supportive family/friends  Treatment Modalities: Medication Management, Group therapy, Case management,  1 to 1 session with clinician, Psychoeducation, Recreational therapy.   Physician Treatment Plan for Primary Diagnosis: Severe recurrent major depression without psychotic features (HCC) Long Term Goal(s): Improvement in symptoms so as ready for discharge Improvement in symptoms so as ready for discharge   Short Term Goals: Ability to identify changes in lifestyle to reduce recurrence of condition will improve Ability to verbalize feelings will improve Ability to disclose and discuss suicidal ideas Ability to demonstrate self-control will improve Ability to identify and develop effective coping behaviors will improve Ability to maintain clinical measurements within normal limits will improve Compliance with prescribed medications will improve Ability to identify triggers associated  with substance abuse/mental health issues will improve Ability to identify changes in lifestyle to reduce recurrence of condition will improve Ability to verbalize feelings will improve Ability to disclose and discuss suicidal ideas Ability to demonstrate self-control will improve Ability to identify and develop effective coping behaviors will improve Ability to maintain clinical measurements within normal limits will improve Compliance with prescribed medications will improve Ability to identify  triggers associated with substance abuse/mental health issues will improve  Medication Management: Evaluate patient's response, side effects, and tolerance of medication regimen.  Therapeutic Interventions: 1 to 1 sessions, Unit Group sessions and Medication administration.  Evaluation of Outcomes: Adequate for Discharge  Physician Treatment Plan for Secondary Diagnosis: Principal Problem:   Severe recurrent major depression without psychotic features (HCC) Active Problems:   Borderline personality disorder (HCC)  Long Term Goal(s): Improvement in symptoms so as ready for discharge Improvement in symptoms so as ready for discharge   Short Term Goals: Ability to identify changes in lifestyle to reduce recurrence of condition will improve Ability to verbalize feelings will improve Ability to disclose and discuss suicidal ideas Ability to demonstrate self-control will improve Ability to identify and develop effective coping behaviors will improve Ability to maintain clinical measurements within normal limits will improve Compliance with prescribed medications will improve Ability to identify triggers associated with substance abuse/mental health issues will improve Ability to identify changes in lifestyle to reduce recurrence of condition will improve Ability to verbalize feelings will improve Ability to disclose and discuss suicidal ideas Ability to demonstrate self-control will improve Ability to identify and develop effective coping behaviors will improve Ability to maintain clinical measurements within normal limits will improve Compliance with prescribed medications will improve Ability to identify triggers associated with substance abuse/mental health issues will improve     Medication Management: Evaluate patient's response, side effects, and tolerance of medication regimen.  Therapeutic Interventions: 1 to 1 sessions, Unit Group sessions and Medication  administration.  Evaluation of Outcomes: Adequate for Discharge   RN Treatment Plan for Primary Diagnosis: Severe recurrent major depression without psychotic features (HCC) Long Term Goal(s): Knowledge of disease and therapeutic regimen to maintain health will improve  Short Term Goals: Ability to remain free from injury will improve, Ability to demonstrate self-control, Ability to disclose and discuss suicidal ideas and Ability to identify and develop effective coping behaviors will improve  Medication Management: RN will administer medications as ordered by provider, will assess and evaluate patient's response and provide education to patient for prescribed medication. RN will report any adverse and/or side effects to prescribing provider.  Therapeutic Interventions: 1 on 1 counseling sessions, Psychoeducation, Medication administration, Evaluate responses to treatment, Monitor vital signs and CBGs as ordered, Perform/monitor CIWA, COWS, AIMS and Fall Risk screenings as ordered, Perform wound care treatments as ordered.  Evaluation of Outcomes: Adequate for Discharge   LCSW Treatment Plan for Primary Diagnosis: Severe recurrent major depression without psychotic features (HCC) Long Term Goal(s): Safe transition to appropriate next level of care at discharge, Engage patient in therapeutic group addressing interpersonal concerns.  Short Term Goals: Engage patient in aftercare planning with referrals and resources, Facilitate patient progression through stages of change regarding substance use diagnoses and concerns and Increase skills for wellness and recovery  Therapeutic Interventions: Assess for all discharge needs, 1 to 1 time with Social worker, Explore available resources and support systems, Assess for adequacy in community support network, Educate family and significant other(s) on suicide prevention, Complete Psychosocial Assessment, Interpersonal group therapy.  Evaluation of  Outcomes: Adequate for Discharge   Progress in Treatment: Attending groups: Yes. Participating in groups: Yes. Taking medication as prescribed: Yes. Toleration medication: Yes. Family/Significant other contact made: SPE completed with pt; pt declined to consent to collateral contact.  Patient understands diagnosis: Yes. Discussing patient identified problems/goals with staff: Yes. Medical problems stabilized or resolved: Yes. Denies suicidal/homicidal ideation: Yes. Issues/concerns per patient self-inventory: No. Other: n/a  New problem(s) identified: No, Describe:  n/a  New Short Term/Long Term Goal(s): detox, medication management for mood stabilization; elimination of SI thoughts; development of comprehensive mental wellness/sobriety plan.   Patient Goals:  "to get back on my hormones and psych meds and get back on my feet."   Discharge Plan or Barriers: Pt [facility]called and has a bed available for this evening. [facility] appt made. [facility]AG pamphlet, Mobile Crisis information, and AA/NA information provided to patient for additional community support and resources.  Pt declined referral for residential treatment.   Reason for Continuation of Hospitalization: none  Estimated Length of Stay:  Monday, 11/12/2018  Attendees: Patient: Zachary DerrySymark "Bella" Freundlich 11/12/2018 8:43 AM  Physician: Dr. Jeannine KittenFarah MD 11/12/2018 8:43 AM  Nursing: Arlyss RepressAlyssa RN; [facility] 11/12/2018 8:43 AM  RN Care Manager:x 11/12/2018 8:43 AM  Social Worker: Corrie MckusickHeather Sipriano Fendley LCSW 11/12/2018 8:43 AM  Recreational Therapist: x 11/12/2018 8:43 AM  Other: Armandina StammerAgnes Nwoko NP 11/12/2018 8:43 AM  Other:  11/12/2018 8:43 AM  Other: 11/12/2018 8:43 AM    Scribe for Treatment Team: Rona RavensHeather S Yeily Link, LCSW 11/12/2018 8:43 AM

## 2018-11-12 NOTE — Progress Notes (Signed)
Discharge note: Patient reviewed discharge paperwork with RN including prescriptions, follow up appointments, and lab work. Patient given the opportunity to ask questions. All concerns were addressed. All belongings were returned to patient. Denied SI/HI/AVH. Patient thanked staff for their care while at the hospital.  Patient was discharged with a bus pass.

## 2018-11-13 ENCOUNTER — Other Ambulatory Visit: Payer: Self-pay

## 2018-11-13 ENCOUNTER — Emergency Department (HOSPITAL_COMMUNITY): Payer: Self-pay

## 2018-11-13 ENCOUNTER — Emergency Department (HOSPITAL_COMMUNITY)
Admission: EM | Admit: 2018-11-13 | Discharge: 2018-11-13 | Disposition: A | Discharge status: Home / Self Care | Payer: Self-pay | Attending: Emergency Medicine | Admitting: Emergency Medicine

## 2018-11-13 ENCOUNTER — Encounter (HOSPITAL_COMMUNITY): Payer: Self-pay

## 2018-11-13 DIAGNOSIS — J45909 Unspecified asthma, uncomplicated: Secondary | ICD-10-CM | POA: Insufficient documentation

## 2018-11-13 DIAGNOSIS — R2241 Localized swelling, mass and lump, right lower limb: Secondary | ICD-10-CM | POA: Insufficient documentation

## 2018-11-13 DIAGNOSIS — F17210 Nicotine dependence, cigarettes, uncomplicated: Secondary | ICD-10-CM | POA: Insufficient documentation

## 2018-11-13 DIAGNOSIS — Z79899 Other long term (current) drug therapy: Secondary | ICD-10-CM | POA: Insufficient documentation

## 2018-11-13 DIAGNOSIS — F1510 Other stimulant abuse, uncomplicated: Secondary | ICD-10-CM | POA: Insufficient documentation

## 2018-11-13 DIAGNOSIS — F1210 Cannabis abuse, uncomplicated: Secondary | ICD-10-CM | POA: Insufficient documentation

## 2018-11-13 DIAGNOSIS — M25561 Pain in right knee: Secondary | ICD-10-CM | POA: Insufficient documentation

## 2018-11-13 DIAGNOSIS — G8929 Other chronic pain: Secondary | ICD-10-CM | POA: Insufficient documentation

## 2018-11-13 NOTE — ED Triage Notes (Signed)
Pt has fluid in her right knee that she has been suffering with. States it has been going on for 4 years now. NAD. Ambulatory.

## 2018-11-13 NOTE — ED Notes (Signed)
Patient transported to X-ray 

## 2018-11-13 NOTE — ED Notes (Signed)
Pt states unable to put full weight on right knee, ongoing for years.

## 2018-11-13 NOTE — ED Provider Notes (Signed)
[facility] EMERGENCY DEPARTMENT Provider Note   CSN: [id number] Arrival date & time: 11/13/18  1314    History   Chief Complaint Chief Complaint  Patient presents with  . Leg Swelling    HPI Zachary Mcclain is a 18 y.o. adult male to male transgender who presents for evaluation of knee pain and swelling.  Patient states that she has had intermittent left knee and left knee swelling x4 years.  Patient states she did hit her knee in December.  Patient states her pain is been worse since she hit her knee wall rollerskating.  Has not taken anything for her symptoms.  Patient states her knee will occasionally swell, however it is not swelling currently.  States "I just want to figure out what is wrong."  Has not been seen by orthopedics for her knee.  She rates her pain a 3/10.  Pain does not radiate.  Patient states she did previously have x-rays done 3 years ago, however has not had any recently.  Denies fever, chills, nausea, vomiting, chest pain, shortness of breath, abdominal pain, decreased range of motion in her extremities, numbness or tingling in her extremities, warmth, redness to her extremities. Denies recent falls, IVDU, gout, hemarthrosis.  Has been able to ambulate without difficulty.  History obtained from patient.  No interpreter was used.     HPI  Past Medical History:  Diagnosis Date  . Anxiety   . Asthma   . Depression     Patient Active Problem List   Diagnosis Date Noted  . Severe recurrent major depression without psychotic features (HCC) 11/10/2018  . Borderline personality disorder (HCC) 11/10/2018    Past Surgical History:  Procedure Laterality Date  . lower back           Home Medications    Prior to Admission medications   Medication Sig Start Date End Date Taking? Authorizing Provider  ARIPiprazole (ABILIFY) 10 MG tablet Take 1 tablet (10 mg total) by mouth at bedtime. For mood control 11/12/18   Armandina Stammer I, NP  estradiol (ESTRACE) 1 MG tablet  Take 2 tablets (2 mg total) by mouth 3 (three) times daily. Hormonal therapy 11/12/18   Armandina Stammer I, NP  hydrOXYzine (ATARAX/VISTARIL) 25 MG tablet Take 1 tablet (25 mg total) by mouth 3 (three) times daily as needed for anxiety. 11/12/18   Armandina Stammer I, NP  Multiple Vitamin (MULTIVITAMIN WITH MINERALS) TABS tablet Take 1 tablet by mouth daily. (Kall buy from over the counter): Vitamin supplement 11/13/18   Armandina Stammer I, NP  sertraline (ZOLOFT) 50 MG tablet Take 1 tablet (50 mg total) by mouth daily. For depression 11/12/18   Armandina Stammer I, NP  spironolactone (ALDACTONE) 50 MG tablet Take 1 tablet (50 mg total) by mouth 2 (two) times daily. For transgenderism 11/12/18   Armandina Stammer I, NP  traZODone (DESYREL) 50 MG tablet Take 1 tablet (50 mg total) by mouth at bedtime as needed for sleep. 11/12/18   Sanjuana Kava, NP    Family History No family history on file.  Social History Social History   Tobacco Use  . Smoking status: Current Every Day Smoker    Packs/day: 1.00  . Smokeless tobacco: Current User  Substance Use Topics  . Alcohol use: Yes  . Drug use: Yes    Types: Methamphetamines, Marijuana    Comment: presciption pills      Allergies   Patient has no known allergies.   Review of Systems Review of  Systems  Constitutional: Negative.   HENT: Negative.   Respiratory: Negative.   Cardiovascular: Negative.   Gastrointestinal: Negative.   Musculoskeletal: Negative for gait problem.       Right knee pain.  Skin: Negative.   All other systems reviewed and are negative.    Physical Exam Updated Vital Signs BP 138/84 (BP Location: Right Arm)   Pulse (!) 101   Temp 98.4 F (36.9 C) (Oral)   Resp 15   SpO2 100%   Physical Exam Vitals signs and nursing note reviewed.  Constitutional:      General: She is not in acute distress.    Appearance: She is well-developed.  HENT:     Head: Atraumatic.     Nose: Nose normal.     Mouth/Throat:     Mouth: Mucous  membranes are moist.     Pharynx: Oropharynx is clear.  Eyes:     Pupils: Pupils are equal, round, and reactive to light.  Neck:     Musculoskeletal: Normal range of motion.  Cardiovascular:     Rate and Rhythm: Normal rate.     Pulses: Normal pulses.     Heart sounds: Normal heart sounds.  Pulmonary:     Effort: Pulmonary effort is normal. No respiratory distress.     Breath sounds: Normal breath sounds.  Abdominal:     General: There is no distension.     Palpations: Abdomen is soft.     Tenderness: There is no abdominal tenderness. There is no guarding or rebound.  Musculoskeletal: Normal range of motion.     Comments: Tenderness palpation to lateral and superior aspect of right patella.  Negative varus, valgus stress.  Negative anterior drawer.  Full range of motion with flexion and extension, plantarflexion, dorsiflexion.  Extremity compartments are soft.  No evidence of obvious deformities.  No evidence of effusions.  No tenderness to femur or tibia, fibula on right lower extremity. Lower extremity compartments are soft.  No tenderness, warmth, redness to bilateral calves.  Skin:    General: Skin is warm and dry.     Comments: Brisk capillary refill.  No edema, erythema, ecchymosis or warmth to bilateral lower extremities.  Neurological:     Mental Status: She is alert.     Comments: Intact sensation to sharp and dull to bilateral lower extremities.  Patient able to ambulate in department that difficulty.      ED Treatments / Results  Labs (all labs ordered are listed, but only abnormal results are displayed) Labs Reviewed - No data to display  EKG None  Radiology Dg Knee Complete 4 Views Right  Result Date: 11/13/2018 CLINICAL DATA:  Fell roller skating and injured right knee. Initial injury 09/15/2018. EXAM: RIGHT KNEE - COMPLETE 4+ VIEW COMPARISON:  None. FINDINGS: The joint spaces are maintained. No acute fracture or osteochondral lesion. No joint effusion. Small  cortical defect involving the medial femoral condyle only identified on the external oblique image. No obvious associated soft tissue component or calcification. This could be resorbtive changes related to a prior injury such as a subperiosteal hematoma. However, it probably needs further evaluation with MRI. IMPRESSION: No acute bony findings or joint effusion. Small cortical defects involving the medial femoral condyle of uncertain etiology. Recommend MRI for further evaluation. Electronically Signed   By: Rudie Meyer M.D.   On: 11/13/2018 15:15    Procedures Procedures (including critical care time)  Medications Ordered in ED Medications - No data to display   Initial  Impression / Assessment and Plan / ED Course  I have reviewed the triage vital signs and the nursing notes.  Pertinent labs & imaging results that were available during my care of the patient were reviewed by me and considered in my medical decision making (see chart for details).  18 year old patient who presents for evaluation of knee pain.  Afebrile, nonseptic, non-ill-appearing.  Pain has been intermittent over the last 4 years.  States that she did recently hit her knee in December.  Has not seen orthopedics for her knee.  Knee will occasionally swell and be painful.  Normal musculoskeletal exam with flexion, extension, plantarflexion, dorsiflexion.  Negative varus, valgus stress, negative anterior drawer sign. No Edema, erythema, ecchymosis or warmth to bilateral lower extremities.  No calf tenderness, swelling or warmth to suggest DVT.  No obvious effusions or swelling to bilateral knees. No tenderness over bilateral hips. Extremity compartments are soft.  Neurovascularly intact.   No tenderness to femur or tibia, fibula on right lower extremity. Will obtain x-ray to rule out bony deformities and reevaluate.   Xray with small cortical defect of right medial condyle, uncertain for acuity.  Will place patient in knee brace,  provide crutches and have her follow-up with orthopedics for recommended outpatient MRI for further evaluation.  [facility] as she does not live in [place].  Will provide.  Low suspicion for septic joint, gout, hemarthrosis, acute fracture or dislocation.  No evidence of ligament or tendon injuries.  Patient able to ambulate in department however with pain.  I have discussed RICE for symptomatic management.  Will give patient resources to follow-up with orthopedics if she continues to have pain.  Patient hemodynamically stable and appropriate for DC home at this time.  Low suspicion for acute emergent pathology causing patient's symptoms.  I have discussed return precautions.  Patient voiced understanding of return precautions and is agreeable for follow-up.     Final Clinical Impressions(s) / ED Diagnoses   Final diagnoses:  Chronic pain of right knee    ED Discharge Orders    None       Glanda Spanbauer A, PA-C 11/13/18 1553    Donnetta Hutchingook, Brian, MD 11/14/18 [phone]

## 2018-11-13 NOTE — Discharge Instructions (Addendum)
You were evaluated today for right knee pain.  Provided you with a knee sleeve.  You Crocket also ice, rest and elevate your knee.  Please take Tylenol as needed for pain.  Follow up with  orthopedics for reevaluation if you continue to have pain.  I have listed their contact information or discharge paperwork.  You will need to call them to schedule an appointment.  Return to the ED with any new or worsening symptoms.

## 2018-11-13 NOTE — Clinical Social Work Note (Signed)
Pt states she has nowhere to stay tonight.  States she recently relocated from [place] and has relatives in the area, but is unable to stay with them.  Furthermore she states that she walked from [place] to [place] to stay at [facility] last night, and that was the last of her money.  Shelters in [place], [place] and [place] are all full.  Bed is available at [facility] shelter in [place].  Consulted with AC who agreed to pay for cab to send patient.  CSW sign off.

## 2018-11-30 ENCOUNTER — Emergency Department (HOSPITAL_COMMUNITY): Payer: Self-pay

## 2018-11-30 ENCOUNTER — Encounter (HOSPITAL_COMMUNITY): Payer: Self-pay | Admitting: Emergency Medicine

## 2018-11-30 ENCOUNTER — Other Ambulatory Visit: Payer: Self-pay

## 2018-11-30 ENCOUNTER — Emergency Department (HOSPITAL_COMMUNITY)
Admission: EM | Admit: 2018-11-30 | Discharge: 2018-11-30 | Disposition: A | Discharge status: Home / Self Care | Payer: Self-pay | Attending: Emergency Medicine | Admitting: Emergency Medicine

## 2018-11-30 DIAGNOSIS — F17210 Nicotine dependence, cigarettes, uncomplicated: Secondary | ICD-10-CM | POA: Insufficient documentation

## 2018-11-30 DIAGNOSIS — Z79899 Other long term (current) drug therapy: Secondary | ICD-10-CM | POA: Insufficient documentation

## 2018-11-30 DIAGNOSIS — J40 Bronchitis, not specified as acute or chronic: Secondary | ICD-10-CM | POA: Insufficient documentation

## 2018-11-30 MED ORDER — DOCUSATE SODIUM 100 MG PO CAPS
100.00 | ORAL_CAPSULE | ORAL | Status: DC
Start: ? — End: 2018-11-30

## 2018-11-30 MED ORDER — ARIPIPRAZOLE 10 MG PO TABS
10.00 | ORAL_TABLET | ORAL | Status: DC
Start: 2018-11-30 — End: 2018-11-30

## 2018-11-30 MED ORDER — BENZONATATE 100 MG PO CAPS: 100.0000 mg | ORAL_CAPSULE | Freq: Three times a day (TID) | ORAL | 0 refills | Status: DC

## 2018-11-30 MED ORDER — SERTRALINE HCL 100 MG PO TABS
100.00 | ORAL_TABLET | ORAL | Status: DC
Start: 2018-11-30 — End: 2018-11-30

## 2018-11-30 MED ORDER — TRAZODONE HCL 50 MG PO TABS
50.00 | ORAL_TABLET | ORAL | Status: DC
Start: 2018-11-29 — End: 2018-11-30

## 2018-11-30 MED ORDER — IBUPROFEN 400 MG PO TABS
400.00 | ORAL_TABLET | ORAL | Status: DC
Start: ? — End: 2018-11-30

## 2018-11-30 MED ORDER — LORAZEPAM 2 MG/ML IJ SOLN
2.00 | INTRAMUSCULAR | Status: DC
Start: ? — End: 2018-11-30

## 2018-11-30 MED ORDER — GENERIC EXTERNAL MEDICATION
5.00 | Status: DC
Start: ? — End: 2018-11-30

## 2018-11-30 MED ORDER — AZITHROMYCIN 250 MG PO TABS: 250.0000 mg | ORAL_TABLET | Freq: Every day | ORAL | 0 refills | Status: DC

## 2018-11-30 MED ORDER — ALUMINUM-MAGNESIUM-SIMETHICONE 200-200-20 MG/5ML PO SUSP
30.00 | ORAL | Status: DC
Start: ? — End: 2018-11-30

## 2018-11-30 MED ORDER — BENZOCAINE-MENTHOL 6-10 MG MT LOZG
1.00 | LOZENGE | OROMUCOSAL | Status: DC
Start: ? — End: 2018-11-30

## 2018-11-30 NOTE — ED Triage Notes (Signed)
Pt c/o "weird ass cough for 2 weeks". Cough is productive with green-yellow and sometimes blood in it.

## 2018-11-30 NOTE — ED Provider Notes (Signed)
[facility]-EMERGENCY DEPT Provider Note   CSN: [id number] Arrival date & time: 11/30/18  1217    History   Chief Complaint Chief Complaint  Patient presents with  . Cough    HPI Zachary Mcclain is a 18 y.o. adult with hx of asthma who presents to the ED with c/o cough. The cough started 2 weeks ago. Patient reports the cough is productive with green-yellow sputum with occasional speck of blood. Patient reports taking OTC medications without relief. Patient reports in the beginning he thought he was getting better with OTC medications but then it got worse.      The history is provided by the patient. No language interpreter was used.  Cough  Cough characteristics:  Productive Sputum characteristics:  Green and yellow Severity:  Moderate Onset quality:  Gradual Duration:  2 weeks Timing:  Intermittent Progression:  Worsening Chronicity:  New Smoker: yes   Relieved by:  Nothing Worsened by:  Nothing Ineffective treatments:  Cough suppressants Associated symptoms: no chills, no ear pain, no eye discharge, no fever, no headaches, no rash, no shortness of breath, no sore throat and no wheezing  Chest pain: with cough only.     Past Medical History:  Diagnosis Date  . Anxiety   . Asthma   . Depression     Patient Active Problem List   Diagnosis Date Noted  . Severe recurrent major depression without psychotic features (HCC) 11/10/2018  . Borderline personality disorder (HCC) 11/10/2018    Past Surgical History:  Procedure Laterality Date  . BACK SURGERY    . lower back           Home Medications    Prior to Admission medications   Medication Sig Start Date End Date Taking? Authorizing Provider  ARIPiprazole (ABILIFY) 10 MG tablet Take 1 tablet (10 mg total) by mouth at bedtime. For mood control 11/12/18   Armandina Stammer I, NP  azithromycin (ZITHROMAX) 250 MG tablet Take 1 tablet (250 mg total) by mouth daily. Take first 2 tablets together, then 1  every day until finished. 11/30/18   Janne Napoleon, NP  benzonatate (TESSALON) 100 MG capsule Take 1 capsule (100 mg total) by mouth every 8 (eight) hours. 11/30/18   Janne Napoleon, NP  estradiol (ESTRACE) 1 MG tablet Take 2 tablets (2 mg total) by mouth 3 (three) times daily. Hormonal therapy 11/12/18   Armandina Stammer I, NP  hydrOXYzine (ATARAX/VISTARIL) 25 MG tablet Take 1 tablet (25 mg total) by mouth 3 (three) times daily as needed for anxiety. 11/12/18   Armandina Stammer I, NP  Multiple Vitamin (MULTIVITAMIN WITH MINERALS) TABS tablet Take 1 tablet by mouth daily. (Micke buy from over the counter): Vitamin supplement 11/13/18   Armandina Stammer I, NP  sertraline (ZOLOFT) 50 MG tablet Take 1 tablet (50 mg total) by mouth daily. For depression 11/12/18   Armandina Stammer I, NP  spironolactone (ALDACTONE) 50 MG tablet Take 1 tablet (50 mg total) by mouth 2 (two) times daily. For transgenderism 11/12/18   Armandina Stammer I, NP  traZODone (DESYREL) 50 MG tablet Take 1 tablet (50 mg total) by mouth at bedtime as needed for sleep. 11/12/18   Sanjuana Kava, NP    Family History No family history on file.  Social History Social History   Tobacco Use  . Smoking status: Current Every Day Smoker    Packs/day: 1.00    Types: Cigarettes  . Smokeless tobacco: Current User  Substance Use Topics  .  Alcohol use: Yes  . Drug use: Yes    Types: Methamphetamines, Marijuana    Comment: presciption pills      Allergies   Patient has no known allergies.   Review of Systems Review of Systems  Constitutional: Negative for chills and fever.  HENT: Positive for congestion. Negative for ear pain and sore throat.   Eyes: Negative for discharge and redness.  Respiratory: Positive for cough. Negative for shortness of breath and wheezing.   Cardiovascular: Chest pain: with cough only.  Gastrointestinal: Negative for abdominal pain, nausea and vomiting.  Genitourinary: Negative for dysuria.  Skin: Negative for rash.    Neurological: Negative for headaches.  Hematological: Negative for adenopathy.  Psychiatric/Behavioral: Negative for confusion.     Physical Exam Updated Vital Signs BP (!) 137/92 (BP Location: Right Arm)   Pulse 86   Temp 98.9 F (37.2 C) (Oral)   Resp 16   SpO2 100%   Physical Exam Vitals signs and nursing note reviewed.  Constitutional:      General: She is not in acute distress.    Appearance: Normal appearance. She is well-developed.  HENT:     Head: Normocephalic.     Right Ear: Tympanic membrane normal.     Left Ear: Tympanic membrane normal.     Nose: Congestion present.     Mouth/Throat:     Mouth: Mucous membranes are moist.     Pharynx: No oropharyngeal exudate or posterior oropharyngeal erythema.  Eyes:     Extraocular Movements: Extraocular movements intact.     Conjunctiva/sclera: Conjunctivae normal.  Neck:     Musculoskeletal: Neck supple. No neck rigidity.  Cardiovascular:     Rate and Rhythm: Normal rate and regular rhythm.  Pulmonary:     Effort: Pulmonary effort is normal.     Breath sounds: Rhonchi present. No wheezing or rales.  Abdominal:     Palpations: Abdomen is soft.     Tenderness: There is no abdominal tenderness.  Musculoskeletal: Normal range of motion.  Skin:    General: Skin is warm and dry.  Neurological:     Mental Status: She is alert and oriented to person, place, and time.  Psychiatric:        Mood and Affect: Mood normal.      ED Treatments / Results  Labs (all labs ordered are listed, but only abnormal results are displayed) Labs Reviewed - No data to display  Radiology Dg Chest 2 View  Result Date: 11/30/2018 CLINICAL DATA:  Productive cough for 2 weeks. EXAM: CHEST - 2 VIEW COMPARISON:  None. FINDINGS: The heart size and mediastinal contours are within normal limits. Both lungs are clear. The visualized skeletal structures are unremarkable. IMPRESSION: No active cardiopulmonary disease. Electronically Signed   By:  Sherian Rein M.D.   On: 11/30/2018 13:43    Procedures Procedures (including critical care time)  Medications Ordered in ED Medications - No data to display   Initial Impression / Assessment and Plan / ED Course  I have reviewed the triage vital signs and the nursing notes. Pt CXR negative for acute infiltrate. Patients symptoms are consistent with bronchitis. Patient is an everyday smoker.  discharged home with Z-pak and cough medication.  Discussed plan of care. Patient verbalizes understanding and is agreeable with plan. Pt is hemodynamically stable & in NAD prior to dc.  Final Clinical Impressions(s) / ED Diagnoses   Final diagnoses:  Bronchitis    ED Discharge Orders  Ordered    benzonatate (TESSALON) 100 MG capsule  Every 8 hours     11/30/18 1358    azithromycin (ZITHROMAX) 250 MG tablet  Daily     11/30/18 1358           Kerrie [place], [place] 11/30/18 1555    Raeford Razor, MD 12/01/18 [phone]

## 2018-12-05 ENCOUNTER — Encounter (HOSPITAL_COMMUNITY): Payer: Self-pay | Admitting: Emergency Medicine

## 2018-12-05 ENCOUNTER — Emergency Department (HOSPITAL_COMMUNITY)
Admission: EM | Admit: 2018-12-05 | Discharge: 2018-12-06 | Disposition: A | Discharge status: Home / Self Care | Payer: Self-pay | Attending: Emergency Medicine | Admitting: Emergency Medicine

## 2018-12-05 DIAGNOSIS — R569 Unspecified convulsions: Secondary | ICD-10-CM | POA: Insufficient documentation

## 2018-12-05 HISTORY — DX: Unspecified convulsions: R56.9

## 2018-12-05 HISTORY — DX: Bipolar disorder, unspecified: F31.9

## 2018-12-05 HISTORY — DX: Migraine, unspecified, not intractable, without status migrainosus: G43.909

## 2018-12-05 HISTORY — DX: Scoliosis, unspecified: M41.9

## 2018-12-05 HISTORY — DX: Post-traumatic stress disorder, unspecified: F43.10

## 2018-12-05 LAB — I-STAT BETA HCG BLOOD, ED (MC, WL, AP ONLY): I-stat hCG, quantitative: <5 m[IU]/mL (ref <5)

## 2018-12-05 NOTE — ED Triage Notes (Addendum)
Patient arrived with EMS from [facility] ( shelter) ,seizure episode approx. 5 mins. this evening witnessed by friend , alert and oriented at arrival , denies pain/respirations unlabored . CBG= 78 .

## 2018-12-05 NOTE — ED Notes (Addendum)
Seizure pads applied at siderails .

## 2018-12-06 ENCOUNTER — Encounter (HOSPITAL_COMMUNITY): Payer: Self-pay | Admitting: Emergency Medicine

## 2018-12-06 LAB — COMPREHENSIVE METABOLIC PANEL
ALT: 14 U/L (ref 0–44)
AST: 21 U/L (ref 15–41)
Albumin: 3.7 g/dL (ref 3.5–5.0)
Alkaline Phosphatase: 91 U/L (ref 38–126)
Anion gap: 7 (ref 5–15)
BUN: 15 mg/dL (ref 6–20)
CALCIUM: 9.3 mg/dL (ref 8.9–10.3)
CO2: 22 mmol/L (ref 22–32)
Chloride: 109 mmol/L (ref 98–111)
Creatinine, Ser: 0.94 mg/dL (ref 0.61–1.24)
Glucose, Bld: 87 mg/dL (ref 70–99)
Potassium: 3.7 mmol/L (ref 3.5–5.1)
Sodium: 138 mmol/L (ref 135–145)
Total Bilirubin: 0.4 mg/dL (ref 0.3–1.2)
Total Protein: 7 g/dL (ref 6.5–8.1)

## 2018-12-06 LAB — CBC WITH DIFFERENTIAL/PLATELET
Abs Immature Granulocytes: 0.01 10*3/uL (ref 0.00–0.07)
Basophils Absolute: 0 10*3/uL (ref 0.0–0.1)
Basophils Relative: 0 %
EOS PCT: 1 %
Eosinophils Absolute: 0.1 10*3/uL (ref 0.0–0.5)
HCT: 42.8 % (ref 39.0–52.0)
Hemoglobin: 13.9 g/dL (ref 13.0–17.0)
Immature Granulocytes: 0 %
Lymphocytes Relative: 54 %
Lymphs Abs: 4.2 10*3/uL — ABNORMAL HIGH (ref 0.7–4.0)
MCH: 28.2 pg (ref 26.0–34.0)
MCHC: 32.5 g/dL (ref 30.0–36.0)
MCV: 86.8 fL (ref 80.0–100.0)
Monocytes Absolute: 0.5 10*3/uL (ref 0.1–1.0)
Monocytes Relative: 7 %
Neutro Abs: 2.9 10*3/uL (ref 1.7–7.7)
Neutrophils Relative %: 38 %
Platelets: 292 10*3/uL (ref 150–400)
RBC: 4.93 MIL/uL (ref 4.22–5.81)
RDW: 14.1 % (ref 11.5–15.5)
WBC: 7.8 10*3/uL (ref 4.0–10.5)
nRBC: 0 % (ref 0.0–0.2)

## 2018-12-06 MED ORDER — LEVETIRACETAM 500 MG PO TABS: 500.0000 mg | ORAL_TABLET | Freq: Two times a day (BID) | ORAL | 0 refills | Status: DC

## 2018-12-06 MED ORDER — LEVETIRACETAM 500 MG PO TABS
500.0000 mg | ORAL_TABLET | Freq: Once | ORAL | Status: AC
  Administered 2018-12-06: 500 mg via ORAL

## 2018-12-06 NOTE — ED Provider Notes (Signed)
[facility] EMERGENCY DEPARTMENT Provider Note   CSN: [id number] Arrival date & time: 12/05/18  2236    History   Chief Complaint Chief Complaint  Patient presents with  . Seizures    HPI Zachary Mcclain is a 18 y.o. adult.     The history is provided by the patient.  Seizures  Return to baseline: yes   Severity:  Moderate Progression:  Resolved History of seizures: yes   Patient history of asthma, bipolar, seizures presents with seizure. Is reported patient presents from a shelter for seizure lasting 5 minutes.  Apparently the seizure terminated spontaneously.  Patient is currently awake and alert.  She reports she takes Keppra, but has been missing it for a week She feels back to baseline other than mild dizziness.  No other acute complaints Past Medical History:  Diagnosis Date  . Asthma   . Bipolar 1 disorder (HCC)   . Depression   . Migraine   . PTSD (post-traumatic stress disorder)   . Scoliosis   . Seizures (HCC)     There are no active problems to display for this patient.   Past Surgical History:  Procedure Laterality Date  . BACK SURGERY          Home Medications    Prior to Admission medications   Medication Sig Start Date End Date Taking? Authorizing Provider  levETIRAcetam (KEPPRA) 500 MG tablet Take 1 tablet (500 mg total) by mouth 2 (two) times daily. 12/06/18   Zadie Rhine, MD  levETIRAcetam (KEPPRA) 500 MG tablet Take 1 tablet (500 mg total) by mouth 2 (two) times daily. 12/06/18   Zadie Rhine, MD    Family History No family history on file.  Social History Social History   Tobacco Use  . Smoking status: Never Smoker  . Smokeless tobacco: Never Used  Substance Use Topics  . Alcohol use: Never    Frequency: Never  . Drug use: Never     Allergies   Patient has no known allergies.   Review of Systems Review of Systems  Constitutional: Negative for fever.  Cardiovascular: Negative for chest pain.   Gastrointestinal: Negative for abdominal pain.  Neurological: Positive for dizziness and seizures. Negative for weakness and headaches.  All other systems reviewed and are negative.    Physical Exam Updated Vital Signs BP 105/69   Pulse 63   Temp 97.6 F (36.4 C) (Oral)   Resp 15   Ht 1.626 m (5\' 4" )   Wt 53.1 kg   SpO2 98%   BMI 20.08 kg/m   Physical Exam CONSTITUTIONAL: Well developed/well nourished HEAD: Normocephalic/atraumatic EYES: EOMI/PERRL ENMT: Mucous membranes moist no tongue laceration NECK: supple no meningeal signs SPINE/BACK:entire spine nontender CV: S1/S2 noted, no murmurs/rubs/gallops noted LUNGS: Lungs are clear to auscultation bilaterally, no apparent distress ABDOMEN: soft, nontender, no rebound or guarding, bowel sounds noted throughout abdomen GU:no cva tenderness NEURO: Pt is awake/alert/appropriate, moves all extremitiesx4.  No facial droop.  GCS 15.  Patient ambulates without difficulty EXTREMITIES: pulses normal/equal, full ROM SKIN: warm, color normal PSYCH: no abnormalities of mood noted, alert and oriented to situation   ED Treatments / Results  Labs (all labs ordered are listed, but only abnormal results are displayed) Labs Reviewed  CBC WITH DIFFERENTIAL/PLATELET - Abnormal; Notable for the following components:      Result Value   Lymphs Abs 4.2 (*)    All other components within normal limits  COMPREHENSIVE METABOLIC PANEL  I-STAT BETA HCG BLOOD,  ED [facility], [facility], [facility] ONLY)    EKG EKG Interpretation  Date/Time:  Wednesday December 05 2018 22:37:18 EDT Ventricular Rate:  74 PR Interval:    QRS Duration: 96 QT Interval:  327 QTC Calculation: 363 R Axis:   80 Text Interpretation:  Sinus arrhythmia Prolonged PR interval Consider left atrial enlargement No previous ECGs available Confirmed by Zadie Rhine ([id number]) on 12/06/2018 12:29:55 AM   Radiology No results found.  Procedures Procedures (including critical care time)   Medications Ordered in ED Medications  levETIRAcetam (KEPPRA) tablet 500 mg (500 mg Oral Given 12/06/18 0056)     Initial Impression / Assessment and Plan / ED Course  I have reviewed the triage vital signs and the nursing notes.  Pertinent labs  results that were available during my care of the patient were reviewed by me and considered in my medical decision making (see chart for details).        Patient well-appearing no acute distress Reports history of seizures as well as supposed to be on Keppra but has been missing for a week.  Oral dose given here and patient had prescription sent to pharmacy. She gave a false name on arrival, which made tracking her history difficult. Name at this time is Zachary Mcclain Was not till after discharge that the charts were merged. Otherwise though patient is appropriate for discharge home.  Referred to outpatient provider.  We discussed seizure precautions including no driving or bathing alone  Final Clinical Impressions(s) / ED Diagnoses   Final diagnoses:  Seizure [facility])    ED Discharge Orders         Ordered    levETIRAcetam (KEPPRA) 500 MG tablet  2 times daily     12/06/18 0043    levETIRAcetam (KEPPRA) 500 MG tablet  2 times daily     12/06/18 0056           Zadie Rhine, MD 12/06/18 0122

## 2018-12-06 NOTE — Discharge Instructions (Addendum)
Please be aware you Varin have another seizure ° °Do not drive until seen by your physician for your condition ° °Do not climb ladders/roofs/trees as a seizure can occur at that height and cause serious harm ° °Do not bathe/swim alone as a seizure can occur and cause serious harm ° °Please followup with your physician or neurologist for further testing and possible treatment ° ° °

## 2018-12-28 ENCOUNTER — Other Ambulatory Visit: Payer: Self-pay

## 2018-12-28 ENCOUNTER — Emergency Department (HOSPITAL_COMMUNITY)
Admission: EM | Admit: 2018-12-28 | Discharge: 2018-12-29 | Disposition: A | Discharge status: Other Acute Inpatient Hospital | Payer: Self-pay | Attending: Emergency Medicine | Admitting: Emergency Medicine

## 2018-12-28 ENCOUNTER — Encounter (HOSPITAL_COMMUNITY): Payer: Self-pay | Admitting: Emergency Medicine

## 2018-12-28 DIAGNOSIS — Z7982 Long term (current) use of aspirin: Secondary | ICD-10-CM | POA: Insufficient documentation

## 2018-12-28 DIAGNOSIS — Z79899 Other long term (current) drug therapy: Secondary | ICD-10-CM | POA: Insufficient documentation

## 2018-12-28 DIAGNOSIS — F332 Major depressive disorder, recurrent severe without psychotic features: Secondary | ICD-10-CM | POA: Insufficient documentation

## 2018-12-28 DIAGNOSIS — J45909 Unspecified asthma, uncomplicated: Secondary | ICD-10-CM | POA: Insufficient documentation

## 2018-12-28 DIAGNOSIS — F1220 Cannabis dependence, uncomplicated: Secondary | ICD-10-CM | POA: Insufficient documentation

## 2018-12-28 DIAGNOSIS — F1590 Other stimulant use, unspecified, uncomplicated: Secondary | ICD-10-CM | POA: Insufficient documentation

## 2018-12-28 DIAGNOSIS — R45851 Suicidal ideations: Secondary | ICD-10-CM | POA: Insufficient documentation

## 2018-12-28 LAB — RAPID URINE DRUG SCREEN, HOSP PERFORMED
Amphetamines: NOT DETECTED
Barbiturates: NOT DETECTED
Benzodiazepines: NOT DETECTED
Cocaine: NOT DETECTED
Opiates: NOT DETECTED
Tetrahydrocannabinol: NOT DETECTED

## 2018-12-28 LAB — CBC WITH DIFFERENTIAL/PLATELET
Abs Immature Granulocytes: 0.01 10*3/uL (ref 0.00–0.07)
Basophils Absolute: 0 10*3/uL (ref 0.0–0.1)
Basophils Relative: 1 %
Eosinophils Absolute: 0.2 10*3/uL (ref 0.0–0.5)
Eosinophils Relative: 3 %
HCT: 44 % (ref 39.0–52.0)
Hemoglobin: 14.2 g/dL (ref 13.0–17.0)
Immature Granulocytes: 0 %
Lymphocytes Relative: 51 %
Lymphs Abs: 3.6 10*3/uL (ref 0.7–4.0)
MCH: 28.5 pg (ref 26.0–34.0)
MCHC: 32.3 g/dL (ref 30.0–36.0)
MCV: 88.2 fL (ref 80.0–100.0)
Monocytes Absolute: 0.4 10*3/uL (ref 0.1–1.0)
Monocytes Relative: 6 %
Neutro Abs: 2.7 10*3/uL (ref 1.7–7.7)
Neutrophils Relative %: 39 %
Platelets: 258 10*3/uL (ref 150–400)
RBC: 4.99 MIL/uL (ref 4.22–5.81)
RDW: 14.1 % (ref 11.5–15.5)
WBC: 7 10*3/uL (ref 4.0–10.5)
nRBC: 0 % (ref 0.0–0.2)

## 2018-12-28 LAB — SALICYLATE LEVEL: Salicylate Lvl: <7 mg/dL (ref 2.8–30.0)

## 2018-12-28 LAB — COMPREHENSIVE METABOLIC PANEL
ALT: 15 U/L (ref 0–44)
AST: 25 U/L (ref 15–41)
Albumin: 3.8 g/dL (ref 3.5–5.0)
Alkaline Phosphatase: 91 U/L (ref 38–126)
Anion gap: 4 — ABNORMAL LOW (ref 5–15)
BUN: 7 mg/dL (ref 6–20)
CO2: 31 mmol/L (ref 22–32)
Calcium: 9.4 mg/dL (ref 8.9–10.3)
Chloride: 102 mmol/L (ref 98–111)
Creatinine, Ser: 0.99 mg/dL (ref 0.61–1.24)
GFR calc Af Amer: >60 mL/min (ref >60)
GFR calc non Af Amer: >60 mL/min (ref >60)
Glucose, Bld: 97 mg/dL (ref 70–99)
Potassium: 3.8 mmol/L (ref 3.5–5.1)
Sodium: 137 mmol/L (ref 135–145)
Total Bilirubin: 0.3 mg/dL (ref 0.3–1.2)
Total Protein: 6.7 g/dL (ref 6.5–8.1)

## 2018-12-28 LAB — ACETAMINOPHEN LEVEL: Acetaminophen (Tylenol), Serum: <10 ug/mL — ABNORMAL LOW (ref 10–30)

## 2018-12-28 LAB — ETHANOL: Alcohol, Ethyl (B): <10 mg/dL (ref <10)

## 2018-12-28 NOTE — ED Notes (Signed)
[facility] notified of transport

## 2018-12-28 NOTE — ED Triage Notes (Signed)
Pt arrives to ED with C/O of SI thoughts after getting kicked out her home as well a break up with Boy friend. Pt report recent meth use and occasional etoh. Been off all medication for 3 months.
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
## 2018-12-28 NOTE — ED Notes (Signed)
Patient wanded by security, belongings removed from room and placed at nurse's station.

## 2018-12-28 NOTE — ED Provider Notes (Signed)
[facility] EMERGENCY DEPARTMENT Provider Note   CSN: [id number] Arrival date & time: 12/28/18  1752    History   Chief Complaint Chief Complaint  Patient presents with  . Suicidal    HPI Zachary Mcclain is a 18 y.o. transgender male with history of anxiety, bipolar, anxiety, PTSD presenting to emergency department today with chief complaint of suicidal ideations.  Patient states she is feeling suicidal ever since the coronavirus pandemic started.  She has not liked social distancing and states she feels cut off from society.  Also recently broke up with her boyfriend and has been kicked out of her house by family for being transgender.  Patient reports recently using meth 1 week ago to get away from her problems however it did not help.  Today patient was bending to drink bleach however her aunt walked into the room saw what she was doing and stopped her before any bleach was consumed. Denies any fever, chills, chest pain, nausea, abdominal pain, homicidal ideations, hallucinations.   History provided by patient.      Past Medical History:  Diagnosis Date  . Anxiety   . Asthma   . Bipolar 1 disorder (HCC)   . Depression   . Migraine   . PTSD (post-traumatic stress disorder)   . Scoliosis   . Seizures [facility])     Patient Active Problem List   Diagnosis Date Noted  . Severe recurrent major depression without psychotic features (HCC) 11/10/2018  . Borderline personality disorder (HCC) 11/10/2018    Past Surgical History:  Procedure Laterality Date  . BACK SURGERY    . lower back           Home Medications    Prior to Admission medications   Medication Sig Start Date End Date Taking? Authorizing Provider  ARIPiprazole (ABILIFY) 10 MG tablet Take 1 tablet (10 mg total) by mouth at bedtime. For mood control 11/12/18  Yes Armandina Stammer I, NP  aspirin EC 325 MG tablet Take 325 mg by mouth as needed (for migraines).    Yes [provider]  estradiol  (ESTRACE) 1 MG tablet Take 2 tablets (2 mg total) by mouth 3 (three) times daily. Hormonal therapy 11/12/18  Yes Armandina Stammer I, NP  estrogens-methylTEST (ESTRATEST) 1.25-2.5 MG tablet Take 2 tablets by mouth daily.    Yes [provider]  hydrOXYzine (ATARAX/VISTARIL) 25 MG tablet Take 1 tablet (25 mg total) by mouth 3 (three) times daily as needed for anxiety. 11/12/18  Yes Armandina Stammer I, NP  levETIRAcetam (KEPPRA) 500 MG tablet Take 1 tablet (500 mg total) by mouth 2 (two) times daily. 12/06/18  Yes Zadie Rhine, MD  sertraline (ZOLOFT) 50 MG tablet Take 1 tablet (50 mg total) by mouth daily. For depression 11/12/18  Yes Armandina Stammer I, NP  spironolactone (ALDACTONE) 50 MG tablet Take 1 tablet (50 mg total) by mouth 2 (two) times daily. For transgenderism 11/12/18  Yes Armandina Stammer I, NP  traZODone (DESYREL) 50 MG tablet Take 1 tablet (50 mg total) by mouth at bedtime as needed for sleep. 11/12/18  Yes Armandina Stammer I, NP  azithromycin (ZITHROMAX) 250 MG tablet Take 1 tablet (250 mg total) by mouth daily. Take first 2 tablets together, then 1 every day until finished. Patient not taking: Reported on 12/28/2018 11/30/18   Janne Napoleon, NP  benzonatate (TESSALON) 100 MG capsule Take 1 capsule (100 mg total) by mouth every 8 (eight) hours. Patient not taking: Reported on 12/28/2018  11/30/18   Janne Napoleon, NP  levETIRAcetam (KEPPRA) 500 MG tablet Take 1 tablet (500 mg total) by mouth 2 (two) times daily. Patient not taking: Reported on 12/28/2018 12/06/18   Zadie Rhine, MD  Multiple Vitamin (MULTIVITAMIN WITH MINERALS) TABS tablet Take 1 tablet by mouth daily. (Budzynski buy from over the counter): Vitamin supplement Patient not taking: Reported on 12/28/2018 11/13/18   Armandina Stammer I, NP    Family History No family history on file.  Social History Social History   Tobacco Use  . Smoking status: Never Smoker  . Smokeless tobacco: Never Used  Substance Use Topics  . Alcohol use: Never     Frequency: Never  . Drug use: Never    Types: Methamphetamines, Marijuana    Comment: presciption pills      Allergies   Cayenne and Pineapple   Review of Systems Review of Systems  Constitutional: Negative for chills and fever.  HENT: Negative for congestion, rhinorrhea, sinus pressure and sore throat.   Eyes: Negative for pain and redness.  Respiratory: Negative for cough, shortness of breath and wheezing.   Cardiovascular: Negative for chest pain and palpitations.  Gastrointestinal: Negative for abdominal pain, constipation, diarrhea, nausea and vomiting.  Genitourinary: Negative for dysuria.  Musculoskeletal: Negative for arthralgias, back pain, myalgias and neck pain.  Skin: Negative for rash and wound.  Neurological: Negative for dizziness, syncope, weakness, numbness and headaches.  Psychiatric/Behavioral: Positive for suicidal ideas. Negative for confusion. The patient is hyperactive.      Physical Exam Updated Vital Signs BP 112/70 (BP Location: Right Arm)   Pulse 71   Temp 97.9 F (36.6 C) (Oral)   Resp 17   SpO2 98%   Physical Exam Vitals signs and nursing note reviewed.  Constitutional:      Appearance: She is well-developed. She is not toxic-appearing.  HENT:     Head: Normocephalic and atraumatic.     Nose: Nose normal.     Mouth/Throat:     Mouth: Mucous membranes are moist.     Pharynx: Oropharynx is clear.  Eyes:     General: No scleral icterus.       Right eye: No discharge.        Left eye: No discharge.     Conjunctiva/sclera: Conjunctivae normal.  Neck:     Musculoskeletal: Normal range of motion. No muscular tenderness.  Cardiovascular:     Rate and Rhythm: Normal rate and regular rhythm.     Pulses: Normal pulses.     Heart sounds: Normal heart sounds.  Pulmonary:     Effort: Pulmonary effort is normal.     Breath sounds: Normal breath sounds.  Abdominal:     General: There is no distension.     Palpations: Abdomen is soft.      Tenderness: There is no abdominal tenderness. There is no guarding or rebound.  Musculoskeletal: Normal range of motion.  Skin:    General: Skin is warm and dry.  Neurological:     Mental Status: She is oriented to person, place, and time.     Comments: Fluent speech, no facial droop.  Psychiatric:        Mood and Affect: Mood is anxious.        Speech: Speech is tangential.        Behavior: Behavior normal. Behavior is cooperative.        Thought Content: Thought content includes suicidal ideation. Thought content includes suicidal plan.  Cognition and Memory: Memory normal.        Judgment: Judgment is impulsive.      ED Treatments / Results  Labs (all labs ordered are listed, but only abnormal results are displayed) Labs Reviewed  COMPREHENSIVE METABOLIC PANEL - Abnormal; Notable for the following components:      Result Value   Anion gap 4 (*)    All other components within normal limits  ACETAMINOPHEN LEVEL - Abnormal; Notable for the following components:   Acetaminophen (Tylenol), Serum <10 (*)    All other components within normal limits  ETHANOL  RAPID URINE DRUG SCREEN, HOSP PERFORMED  CBC WITH DIFFERENTIAL/PLATELET  SALICYLATE LEVEL    EKG None  Radiology No results found.  Procedures Procedures (including critical care time)  Medications Ordered in ED Medications - No data to display   Initial Impression / Assessment and Plan / ED Course  I have reviewed the triage vital signs and the nursing notes.  Pertinent labs & imaging results that were available during my care of the patient were reviewed by me and considered in my medical decision making (see chart for details).    Patient is afebrile, nontoxic-appearing, no acute distress.  Currently denying any pain or symptoms.  Patient presents with suicidal ideation and depression.  Patient has psychiatric history and has not been taking medications for at least 2 months because unable to get them.   Will require psychiatric evaluation.    CBC, CMP, ethanol level, UDS all unremarkable.  Acetaminophen level and salicylate level negative.Patient is medically clear for psych eval and treatment.  Pt case discussed with Dr. Adela Lank who agrees with my plan.   Patient evaluated by TTS and accepted to behavioral health.  This note was prepared with assistance of Conservation officer, historic buildings. Occasional wrong-word or sound-a-like substitutions [place] have occurred due to the inherent limitations of voice recognition software.   Final Clinical Impressions(s) / ED Diagnoses   Final diagnoses:  Suicidal ideation    ED Discharge Orders    None       Sherene Sires, PA-C 12/28/18 2322    Melene Plan, DO 12/29/18 1511

## 2018-12-29 ENCOUNTER — Other Ambulatory Visit: Payer: Self-pay

## 2018-12-29 ENCOUNTER — Encounter (HOSPITAL_COMMUNITY): Payer: Self-pay | Admitting: *Deleted

## 2018-12-29 ENCOUNTER — Inpatient Hospital Stay (HOSPITAL_COMMUNITY)
Admission: AD | Admit: 2018-12-29 | Discharge: 2019-01-01 | DRG: 885 | Disposition: A | Discharge status: Home / Self Care | Payer: No Typology Code available for payment source | Source: Intra-hospital | Attending: Psychiatry | Admitting: Psychiatry

## 2018-12-29 DIAGNOSIS — Z79899 Other long term (current) drug therapy: Secondary | ICD-10-CM

## 2018-12-29 DIAGNOSIS — Z915 Personal history of self-harm: Secondary | ICD-10-CM

## 2018-12-29 DIAGNOSIS — F603 Borderline personality disorder: Secondary | ICD-10-CM | POA: Diagnosis present

## 2018-12-29 DIAGNOSIS — M419 Scoliosis, unspecified: Secondary | ICD-10-CM | POA: Diagnosis present

## 2018-12-29 DIAGNOSIS — R45851 Suicidal ideations: Secondary | ICD-10-CM | POA: Diagnosis present

## 2018-12-29 DIAGNOSIS — F1510 Other stimulant abuse, uncomplicated: Secondary | ICD-10-CM | POA: Diagnosis present

## 2018-12-29 DIAGNOSIS — F332 Major depressive disorder, recurrent severe without psychotic features: Secondary | ICD-10-CM | POA: Diagnosis present

## 2018-12-29 DIAGNOSIS — Z79890 Hormone replacement therapy (postmenopausal): Secondary | ICD-10-CM | POA: Diagnosis not present

## 2018-12-29 DIAGNOSIS — F4310 Post-traumatic stress disorder, unspecified: Secondary | ICD-10-CM | POA: Diagnosis present

## 2018-12-29 DIAGNOSIS — J45909 Unspecified asthma, uncomplicated: Secondary | ICD-10-CM | POA: Diagnosis present

## 2018-12-29 DIAGNOSIS — F1290 Cannabis use, unspecified, uncomplicated: Secondary | ICD-10-CM | POA: Diagnosis present

## 2018-12-29 DIAGNOSIS — F649 Gender identity disorder, unspecified: Secondary | ICD-10-CM | POA: Diagnosis present

## 2018-12-29 DIAGNOSIS — F333 Major depressive disorder, recurrent, severe with psychotic symptoms: Principal | ICD-10-CM | POA: Diagnosis present

## 2018-12-29 MED ORDER — TRAZODONE HCL 50 MG PO TABS
50.0000 mg | ORAL_TABLET | Freq: Every evening | ORAL | Status: DC | PRN
  Administered 2018-12-30 – 2018-12-31 (×2): 50 mg via ORAL
  Filled 2018-12-29: qty 1
  Filled 2018-12-29: qty 7
  Filled 2018-12-29: qty 1

## 2018-12-29 MED ORDER — LEVETIRACETAM 500 MG PO TABS
500.0000 mg | ORAL_TABLET | Freq: Two times a day (BID) | ORAL | Status: DC
  Administered 2018-12-29 – 2018-12-31 (×6): 500 mg via ORAL
  Filled 2018-12-29 (×6): qty 1
  Filled 2018-12-29: qty 14
  Filled 2018-12-29 (×2): qty 1
  Filled 2018-12-29: qty 14
  Filled 2018-12-29: qty 1

## 2018-12-29 MED ORDER — ACETAMINOPHEN 325 MG PO TABS: 650.0000 mg | ORAL_TABLET | Freq: Four times a day (QID) | ORAL | Status: DC | PRN

## 2018-12-29 MED ORDER — SERTRALINE HCL 50 MG PO TABS
50.0000 mg | ORAL_TABLET | Freq: Every day | ORAL | Status: DC
  Administered 2018-12-30 – 2018-12-31 (×2): 50 mg via ORAL
  Filled 2018-12-29: qty 1
  Filled 2018-12-29: qty 7
  Filled 2018-12-29 (×4): qty 1

## 2018-12-29 MED ORDER — SPIRONOLACTONE 100 MG PO TABS
50.0000 mg | ORAL_TABLET | Freq: Two times a day (BID) | ORAL | Status: DC
  Administered 2018-12-29 – 2018-12-31 (×5): 50 mg via ORAL
  Filled 2018-12-29: qty 7
  Filled 2018-12-29 (×2): qty 1
  Filled 2018-12-29: qty 7
  Filled 2018-12-29 (×4): qty 2
  Filled 2018-12-29: qty 1
  Filled 2018-12-29 (×2): qty 2
  Filled 2018-12-29 (×2): qty 1

## 2018-12-29 MED ORDER — HYDROXYZINE HCL 25 MG PO TABS
25.0000 mg | ORAL_TABLET | Freq: Three times a day (TID) | ORAL | Status: DC | PRN
  Administered 2018-12-31: 25 mg via ORAL
  Filled 2018-12-29: qty 10
  Filled 2018-12-29: qty 1

## 2018-12-29 MED ORDER — MAGNESIUM HYDROXIDE 400 MG/5ML PO SUSP: 30.0000 mL | Freq: Every day | ORAL | Status: DC | PRN

## 2018-12-29 MED ORDER — ADULT MULTIVITAMIN W/MINERALS CH
1.0000 | ORAL_TABLET | Freq: Every day | ORAL | Status: DC
  Filled 2018-12-29 (×6): qty 1

## 2018-12-29 MED ORDER — ALUM & MAG HYDROXIDE-SIMETH 200-200-20 MG/5ML PO SUSP: 30.0000 mL | ORAL | Status: DC | PRN

## 2018-12-29 MED ORDER — ARIPIPRAZOLE 10 MG PO TABS
10.0000 mg | ORAL_TABLET | Freq: Every day | ORAL | Status: DC
  Administered 2018-12-30 – 2018-12-31 (×2): 10 mg via ORAL
  Filled 2018-12-29 (×2): qty 1
  Filled 2018-12-29: qty 7
  Filled 2018-12-29 (×2): qty 1
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
## 2018-12-29 NOTE — Progress Notes (Signed)
Did not attend group 

## 2018-12-29 NOTE — [facility] Suicide Risk Assessment (Signed)
[facility] INPATIENT:  Family/Significant Other Suicide Prevention Education  Suicide Prevention Education:  Patient Refusal for Family/Significant Other Suicide Prevention Education: The patient Zachary Mcclain has refused to provide written consent for family/significant other to be provided Family/Significant Other Suicide Prevention Education during admission and/or prior to discharge.  Physician notified.  Evorn Gong 12/29/2018, 5:41 PM

## 2018-12-29 NOTE — Progress Notes (Signed)
Pt is a 18 year old male (male to male) transgender pt admitted to [facility] voluntarily after "I just tried to drink bleach."  Pt reports this was a suicide attempt and a friend stopped her.  She continues to endorse SI without a plan and verbally contracts for safety.  Denies HI, denies hallucinations, denies pain.  Pt reports medical history of seizures, migraine, scoliosis, and asthma.  Denies history of abuse.  Reports alcohol use less than monthly.  Denies tobacco use.  She reports she was using "meth" and marijuana.  UDS negative.  Denies having a support system.  Identifies stressors as financial, "stuff with my friends and family," break up with boyfriend, being kicked out of home.  Pt reports she has not been taking her medications for 3 months.    Introduced self to pt.  Actively listened to pt and provided support and encouragement.  Admission process and paperwork completed with pt.  Non-invasive body assessment completed.  Pt has superficial cut to L forearm.  Belongings searched for contraband and items not allowed on unit are in locker 29.  Fall prevention techniques reviewed with pt and she verbalized understanding.  Pt was made a high fall risk because she reports she fell two weeks ago when she had a seizure.  Pt oriented to unit and room.  Provided with meal and PO fluids.  Q15 minute safety checks in place.      Pt is cooperative with admission process.  She verbally contracts for safety and reports she will inform staff of needs and concerns.  Will continue to monitor and assess.

## 2018-12-29 NOTE — Tx Team (Signed)
Initial Treatment Plan 12/29/2018 1:26 AM Mark Reesor TJQ:[id number]    PATIENT STRESSORS: Financial difficulties Marital or family conflict Medication change or noncompliance Substance abuse Other: break up with boyfriend   PATIENT STRENGTHS: Ability for insight Average or above average intelligence Communication skills General fund of knowledge   PATIENT IDENTIFIED PROBLEMS: "self esteem"   "coping skills"     SI  Substance abuse             DISCHARGE CRITERIA:  Improved stabilization in mood, thinking, and/or behavior  PRELIMINARY DISCHARGE PLAN: Attend aftercare/continuing care group  PATIENT/FAMILY INVOLVEMENT: This treatment plan has been presented to and reviewed with the patient, Zachary Mcclain.  The patient and family have been given the opportunity to ask questions and make suggestions.  Arrie Aran, [place] 12/29/2018, 1:26 AM
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
## 2018-12-29 NOTE — Progress Notes (Signed)
Pt refused to have labs drawn despite encouragement from staff.

## 2018-12-29 NOTE — [facility] Group Notes (Signed)
LCSW Group Therapy Note  12/29/2018    10:00-11:00am   Type of Therapy and Topic:  Group Therapy: Early Messages Received About Anger  Participation Level:  Did Not Attend   Description of Group:   In this group, patients shared and discussed the early messages received in their lives about anger through parental or other adult modeling, teaching, repression, punishment, violence, and more.  Participants identified how those childhood lessons influence even now how they usually or often react when angered.  The group discussed that anger is a secondary emotion and what Hilton be the underlying emotional themes that come out through anger outbursts or that are ignored through anger suppression.  Finally, as a group there was a conversation about the workbook's quote that "There is nothing wrong with anger; it is just a sign something needs to change."     Therapeutic Goals: 1. Patients will identify one or more childhood message about anger that they received and how it was taught to them. 2. Patients will discuss how these childhood experiences have influenced and continue to influence their own expression or repression of anger even today. 3. Patients will explore possible primary emotions that tend to fuel their secondary emotion of anger. 4. Patients will learn that anger itself is normal and cannot be eliminated, and that healthier coping skills can assist with resolving conflict rather than worsening situations.  Summary of Patient Progress:   Did not attend Therapeutic Modalities:   Cognitive Behavioral Therapy Motivation Interviewing  Zakirah Weingart, LCSW .  

## 2018-12-29 NOTE — ED Notes (Signed)
Pt belongings transported to [facility] with [facility]ham

## 2018-12-29 NOTE — Progress Notes (Signed)
7a-7p Shift:  D:  Pt has been irritable but cooperative this shift.  She stated that she had slept poorly and had been woken repeatedly by staff.  She continues to endorse passive SI but is contracting for safety.  She stated that she had moved this February to [facility] from [place] "chasing a love" but shared that it didn't "work out" between them and she was left feeling overwhelmed and suicidal from that as well as frequent conflict with her aunt, with whom she had resided until "evicted" just PTA.  Pt stated that her aunt told her she was "crazy" and that the only family members she had that were remotely supportive of her live in [place].  She expressed a desire to return to [place].   A:  Support, education, and encouragement provided as appropriate to situation.  Medications administered per MD order.  Level 3 checks continued for safety.   R:  Pt receptive to measures; Safety maintained.

## 2018-12-29 NOTE — BHH Suicide Risk Assessment (Signed)
[facility] Admission Suicide Risk Assessment   Nursing information obtained from:    Demographic factors:  Adolescent or young adult, Low socioeconomic status, Unemployed Current Mental Status:  Suicidal ideation indicated by patient Loss Factors:  Loss of significant relationship, Financial problems / change in socioeconomic status Historical Factors:  Impulsivity Risk Reduction Factors:  Positive coping skills or problem solving skills  Total Time spent with patient: 45 minutes Principal Problem: Severe recurrent major depression without psychotic features (HCC) Diagnosis:  Principal Problem:   Severe recurrent major depression without psychotic features (HCC)  Subjective Data:   Continued Clinical Symptoms:  Alcohol Use Disorder Identification Test Final Score (AUDIT): 1 The "Alcohol Use Disorders Identification Test", Guidelines for Use in Primary Care, Second Edition.  World Science writer [facility]). Score between 0-7:  no or low risk or alcohol related problems. Score between 8-15:  moderate risk of alcohol related problems. Score between 16-19:  high risk of alcohol related problems. Score 20 or above:  warrants further diagnostic evaluation for alcohol dependence and treatment.   CLINICAL FACTORS:  18 year old transgender male Zachary Mcclain) , presented to ED with family member due to suicidal attempt by ingesting bleach. Of note states that she attempted to ingest bleach but was stopped so there was no actual ingestion of caustic material. She reports suicide attempt was unplanned , impulsive , and in the context of significant stressors. She reports she recently moved to [place] because of her BF but that they broke ok up recently. She also was recently laid off due to coronavirus epidemic, and had been staying with an aunt with whom there has been conflict /tension .  Today patient endorses depression, anxiety related to stressors, but denies suicidal ideations at this time and is future  oriented, focusing on returning to [place], where she used to reside, and where she has more family support . Endorses recent neuro-vegetative symptoms- mainly poor energy level, some anhedonia, recent suicidal ideations.  Denies psychotic symptoms.  Psychiatric History- she reports she has been diagnosed with Borderline Personality Disorder  and PTSD in the past . Reports history of self cutting but not over recent weeks . Endorses history of depressive episodes and history of  brief/short lived mood swings/mood instability. History of prior psychiatric admissions, most recently here at [facility] in February 2020 for similar presentation. At the time was diagnosed with MDD and Borderline Personality Disorder . Was discharged on Abilify, Zoloft , Spironolactone and Estradiol .  At this time denies  recent substance abuse . 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 Admission UDS and BAL negative. Medical Hx- Asthma. NKDA. Of note, reports she was tested for  HIV ( reported to her as negative) about one month ago.   Dx- Suicidal Ideations, MDD versus Adjustment Disorder with Depressed Mood . Borderline Personality Disorder Features  by History   Plan- Inpatient admission Patient restarted on home medications- Zoloft 50 mgrs QDAY, Abilify 10 mgrs QDAY  Lipid Panel, HgbA1C   Musculoskeletal: Strength & Muscle Tone: within normal limits Gait & Station: normal Patient leans: N/A  Psychiatric Specialty Exam: Physical Exam  ROS denies chest pain or shortness of breath, no coughing , no fever or chills   Blood pressure 121/70, pulse 79, temperature 97.9 F (36.6 C), temperature source Oral, resp. rate 14, height 5\' 5"  (1.651 m), weight 56.7 kg.Body mass index is 20.8 kg/m.  General Appearance: Fairly Groomed  Eye Contact:  Fair- tends to improve during session  Speech:  Normal Rate  Volume:  Normal  Mood:  depressed, states feeling better today  Affect:  vaguely  constricted, does smile briefly at times during session  Thought Process:  Linear and Descriptions of Associations: Intact  Orientation:  Other:  fully alert and attentive   Thought Content:  currently denies hallucinations, no delusions , does not present internally preoccupied   Suicidal Thoughts:  No denies suicidal ideations at this time, denies self injurious ideations, contracts for safety on the unit, presents future oriented at this time   Homicidal Thoughts:  No denies   Memory:  recent and remote grossly intact   Judgement:  Other:  improving  Insight:  improving   Psychomotor Activity:  Normal  Concentration:  Concentration: Good and Attention Span: Good  Recall:  Good  Fund of Knowledge:  Good  Language:  Good  Akathisia:  Negative  Handed:  Right  AIMS (if indicated):     Assets:  Communication Skills Desire for Improvement Resilience  ADL's:  Intact  Cognition:  WNL  Sleep:  Number of Hours: 4       COGNITIVE FEATURES THAT CONTRIBUTE TO RISK:  Closed-mindedness and Loss of executive function    SUICIDE RISK:   Moderate:  Frequent suicidal ideation with limited intensity, and duration, some specificity in terms of plans, no associated intent, good self-control, limited dysphoria/symptomatology, some risk factors present, and identifiable protective factors, including available and accessible social support.  PLAN OF CARE: Patient will be admitted to inpatient psychiatric unit for stabilization and safety. Will provide and encourage milieu participation. Provide medication management and maked adjustments as needed.  Will follow daily.    I certify that inpatient services furnished can reasonably be expected to improve the patient's condition.   Craige Cotta, MD 12/29/2018, 3:09 PM

## 2018-12-30 LAB — LIPID PANEL
Cholesterol: 163 mg/dL (ref 0–200)
HDL: 32 mg/dL — ABNORMAL LOW (ref >40)
LDL Cholesterol: 118 mg/dL — ABNORMAL HIGH (ref 0–99)
Total CHOL/HDL Ratio: 5.1 RATIO
Triglycerides: 67 mg/dL (ref <150)
VLDL: 13 mg/dL (ref 0–40)

## 2018-12-30 LAB — TSH: TSH: 0.481 u[IU]/mL (ref 0.350–4.500)

## 2018-12-30 LAB — HEMOGLOBIN A1C
Hgb A1c MFr Bld: 5.2 % (ref 4.8–5.6)
Mean Plasma Glucose: 102.54 mg/dL

## 2018-12-30 NOTE — BHH Group Notes (Signed)
BHH LCSW Group Therapy Note  Date/Time:  12/30/2018 9:00-10:00 or 10:00-11:00AM  Type of Therapy and Topic:  Group Therapy:  Healthy and Unhealthy Supports  Participation Level:  Did Not Attend   Description of Group:  Patients in this group were introduced to the idea of adding a variety of healthy supports to address the various needs in their lives.Patients discussed what additional healthy supports could be helpful in their recovery and wellness after discharge in order to prevent future hospitalizations.   An emphasis was placed on using counselor, doctor, therapy groups, 12-step groups, and problem-specific support groups to expand supports.  They also worked as a group on developing a specific plan for several patients to deal with unhealthy supports through boundary-setting, psychoeducation with loved ones, and even termination of relationships.   Therapeutic Goals:   1)  discuss importance of adding supports to stay well once out of the hospital  2)  compare healthy versus unhealthy supports and identify some examples of each  3)  generate ideas and descriptions of healthy supports that can be added  4)  offer mutual support about how to address unhealthy supports  5)  encourage active participation in and adherence to discharge plan    Summary of Patient Progress:  The patient  Did not attend.   Therapeutic Modalities:   Motivational Interviewing Brief Solution-Focused Therapy  Evorn Gong

## 2018-12-30 NOTE — Progress Notes (Signed)
D: Patient alert and oriented. Patient presents bright and smiling on the unit today, assertive though polite during all interactions. Denies SI, HI, AVH at this time. Denies pain. Patient shares that it is likely that she will be discharged Monday or Tuesday and feels prepared to do so. Patient denies any sleep or appetite disturbances at this time, and denies any physical complaints when asked.   A: Scheduled medications administered to patient per MD order. Support and encouragement provided. Routine safety checks conducted every 15 minutes. Patient informed to notify staff with problems or concerns.  R: No adverse drug reactions noted. Patient contracts for safety at this time. Patient compliant with medications and treatment plan. Patient cooperative. Patient interacts well with others on the unit. Patient remains safe at this time. Will continue to monitor.   Update: Patient asks for this writer to make note in her chart of her home address in [place]. States that this address is: [address], [place] [address].

## 2018-12-30 NOTE — Progress Notes (Addendum)
[facility] MD Progress Note  12/30/2018 1:39 PM Zachary Mcclain  MRN:  [id number]   Subjective: Patient reports today that she is doing good and that she denies any suicidal or homicidal ideations and denies any hallucinations.  She reports that she felt that the she got overwhelmed with everything going on and now she feels that she is getting ready to go.  She states that she has no medication side effects and is glad to have her medications restarted.  She states that her plan is to continue to go back to [place] where she has multiple family members and is already has an established therapist and psychiatrist there.  She also reports that she has a hormone specialist that she has been seeing up there for her transition from male to male.  She reports that she is slept good last night and that she has a good appetite.  Objective: Patient's chart and findings reviewed and discussed with treatment team.  Patient presents in the day room and is interacting with peers and staff appropriately.  Patient is pleasant, calm, and cooperative.  There have been no complaints about the patient on the unit.  Patient's mood and affect have both improved since yesterday and she is no longer irritable.    Principal Problem: Severe recurrent major depression without psychotic features (HCC) Diagnosis: Principal Problem:   Severe recurrent major depression without psychotic features (HCC)  Total Time spent with patient: 20 minutes  Past Psychiatric History: See H&P  Past Medical History:  Past Medical History:  Diagnosis Date  . Anxiety   . Asthma   . Bipolar 1 disorder (HCC)   . Depression   . Migraine   . PTSD (post-traumatic stress disorder)   . Scoliosis   . Seizures (HCC)     Past Surgical History:  Procedure Laterality Date  . BACK SURGERY    . lower back      Family History: History reviewed. No pertinent family history. Family Psychiatric  History: See H&P Social History:  Social History    Substance and Sexual Activity  Alcohol Use Not Currently  . Frequency: Never     Social History   Substance and Sexual Activity  Drug Use Yes  . Types: Methamphetamines, Marijuana   Comment: presciption pills     Social History   Socioeconomic History  . Marital status: Single    Spouse name: Not on file  . Number of children: Not on file  . Years of education: Not on file  . Highest education level: Not on file  Occupational History  . Not on file  Social Needs  . Financial resource strain: Not on file  . Food insecurity:    Worry: Not on file    Inability: Not on file  . Transportation needs:    Medical: Not on file    Non-medical: Not on file  Tobacco Use  . Smoking status: Never Smoker  . Smokeless tobacco: Never Used  Substance and Sexual Activity  . Alcohol use: Not Currently    Frequency: Never  . Drug use: Yes    Types: Methamphetamines, Marijuana    Comment: presciption pills   . Sexual activity: Not on file  Lifestyle  . Physical activity:    Days per week: Not on file    Minutes per session: Not on file  . Stress: Not on file  Relationships  . Social connections:    Talks on phone: Not on file    Gets together: Not  on file    Attends religious service: Not on file    Active member of club or organization: Not on file    Attends meetings of clubs or organizations: Not on file    Relationship status: Not on file  Other Topics Concern  . Not on file  Social History Narrative   ** Merged History Encounter **       Additional Social History:    Pain Medications: denies Prescriptions: denies Over the Counter: denies History of alcohol / drug use?: Yes                    Sleep: Good  Appetite:  Good  Current Medications: Current Facility-Administered Medications  Medication Dose Route Frequency Provider Last Rate Last Dose  . acetaminophen (TYLENOL) tablet 650 mg  650 mg Oral Q6H PRN Nira Conn A, NP      . alum & mag  hydroxide-simeth (MAALOX/MYLANTA) 200-200-20 MG/5ML suspension 30 mL  30 mL Oral Q4H PRN Nira Conn A, NP      . ARIPiprazole (ABILIFY) tablet 10 mg  10 mg Oral QHS Money, Gerlene Burdock, FNP      . hydrOXYzine (ATARAX/VISTARIL) tablet 25 mg  25 mg Oral TID PRN Nira Conn A, NP      . levETIRAcetam (KEPPRA) tablet 500 mg  500 mg Oral BID Nira Conn A, NP   500 mg at 12/30/18 0759  . magnesium hydroxide (MILK OF MAGNESIA) suspension 30 mL  30 mL Oral Daily PRN Nira Conn A, NP      . multivitamin with minerals tablet 1 tablet  1 tablet Oral Daily Nira Conn A, NP      . sertraline (ZOLOFT) tablet 50 mg  50 mg Oral Daily Money, Travis B, FNP   50 mg at 12/30/18 0759  . spironolactone (ALDACTONE) tablet 50 mg  50 mg Oral BID Nira Conn A, NP   50 mg at 12/30/18 0758  . traZODone (DESYREL) tablet 50 mg  50 mg Oral QHS PRN Jackelyn Poling, NP        Lab Results:  Results for orders placed or performed during the hospital encounter of 12/29/18 (from the past 48 hour(s))  Hemoglobin A1c     Status: None   Collection Time: 12/30/18  6:21 AM  Result Value Ref Range   Hgb A1c MFr Bld 5.2 4.8 - 5.6 %    Comment: (NOTE) Pre diabetes:          5.7%-6.4% Diabetes:              >6.4% Glycemic control for   <7.0% adults with diabetes    Mean Plasma Glucose 102.54 mg/dL    Comment: Performed at [facility], [address], [place], [place] [address]  Lipid panel     Status: Abnormal   Collection Time: 12/30/18  6:21 AM  Result Value Ref Range   Cholesterol 163 0 - 200 mg/dL   Triglycerides 67 <604 mg/dL   HDL 32 (L) >54 mg/dL   Total CHOL/HDL Ratio 5.1 RATIO   VLDL 13 0 - 40 mg/dL   LDL Cholesterol 098 (H) 0 - 99 mg/dL    Comment:        Total Cholesterol/HDL:CHD Risk Coronary Heart Disease Risk Table                     Men   Women  1/2 Average Risk   3.4   3.3  Average Risk       5.0   4.4  2 X Average Risk   9.6   7.1  3 X Average Risk  23.4   11.0        Use the calculated  Patient Ratio above and the CHD Risk Table to determine the patient's CHD Risk.        ATP III CLASSIFICATION (LDL):  <100     mg/dL   Optimal  701-779  mg/dL   Near or Above                    Optimal  130-159  mg/dL   Borderline  390-300  mg/dL   High  >923     mg/dL   Very High Performed at [facility], [address], [place], [place] [address]   TSH     Status: None   Collection Time: 12/30/18  6:21 AM  Result Value Ref Range   TSH 0.481 0.350 - 4.500 uIU/mL    Comment: Performed by a 3rd Generation assay with a functional sensitivity of <=0.01 uIU/mL. Performed at [facility], [address] [place], [place] [address]     Blood Alcohol level:  Lab Results  Component Value Date   ETH <10 12/28/2018   ETH <10 11/09/2018    Metabolic Disorder Labs: Lab Results  Component Value Date   HGBA1C 5.2 12/30/2018   MPG 102.54 12/30/2018   No results found for: PROLACTIN Lab Results  Component Value Date   CHOL 163 12/30/2018   TRIG 67 12/30/2018   HDL 32 (L) 12/30/2018   CHOLHDL 5.1 12/30/2018   VLDL 13 12/30/2018   LDLCALC 118 (H) 12/30/2018    Physical Findings: AIMS: Facial and Oral Movements Muscles of Facial Expression: None, normal Lips and Perioral Area: None, normal Jaw: None, normal Tongue: None, normal,Extremity Movements Upper (arms, wrists, hands, fingers): None, normal Lower (legs, knees, ankles, toes): None, normal, Trunk Movements Neck, shoulders, hips: None, normal, Overall Severity Severity of abnormal movements (highest score from questions above): None, normal Incapacitation due to abnormal movements: None, normal Patient's awareness of abnormal movements (rate only patient's report): No Awareness, Dental Status Current problems with teeth and/or dentures?: No Does patient usually wear dentures?: No  CIWA:  CIWA-Ar Total: 0 COWS:  COWS Total Score: 0  Musculoskeletal: Strength & Muscle Tone: within  normal limits Gait & Station: normal Patient leans: N/A  Psychiatric Specialty Exam: Physical Exam  ROS  Blood pressure 107/63, pulse 86, temperature 98.1 F (36.7 C), temperature source Oral, resp. rate 14, height 5\' 5"  (1.651 m), weight 56.7 kg.Body mass index is 20.8 kg/m.  General Appearance: Casual  Eye Contact:  Good  Speech:  Clear and Coherent and Normal Rate  Volume:  Normal  Mood:  Euthymic  Affect:  Congruent  Thought Process:  Goal Directed and Descriptions of Associations: Intact  Orientation:  Full (Time, Place, and Person)  Thought Content:  WDL  Suicidal Thoughts:  No  Homicidal Thoughts:  No  Memory:  Immediate;   Good Recent;   Good Remote;   Good  Judgement:  Fair  Insight:  Good  Psychomotor Activity:  Normal  Concentration:  Concentration: Good and Attention Span: Good  Recall:  Good  Fund of Knowledge:  Good  Language:  Good  Akathisia:  No  Handed:  Right  AIMS (if indicated):     Assets:  Communication Skills Desire for Improvement Physical Health  Resilience Social Print production planner  ADL's:  Intact  Cognition:  WNL  Sleep:  Number of Hours: 6.75   Problems addressed MDD severe recurrent without psychotic features Borderline personality disorder  Treatment Plan Summary: Daily contact with patient to assess and evaluate symptoms and progress in treatment, Medication management and Plan is to: Continue Abilify 10 mg p.o. nightly for mood stability Continue Vistaril 25 mg p.o. 3 times daily PRN for anxiety Continue Keppra 500 mg p.o. twice daily for seizure disorder Continue Zoloft 50 mg p.o. daily for MDD Continue trazodone 50 mg p.o. nightly as needed for insomnia Social work working on disposition planning to assist patient with [employer] bus pass to get back to [place].  I requested social work to look at [employer] for Tuesday as each day the prices have started increasing.  Social work will contact him back with a plan  for him to take it.  Social work was concerned about patient's appointments been made, however patient states that she has an Market researcher and therapist in [place] and social work stated that they could potentially work on getting the patient's appointments arranged tomorrow morning.  Gerlene Burdock Money, FNP 12/30/2018, 1:39 PM   Attest to NP progress note

## 2018-12-30 NOTE — BHH Counselor (Signed)
Clinical Social Work Note  Social Work Air cabin crew expressed a willingness for department to pay for patient's bus ticket to [place] after we are able to confirm she has a specific location to go, rather just ending up homeless in another city/state.  We attempted to contact her friend/"sister" Chief Technology Officer through [employer] without success.  We attempted to contact her mother by phone, left a message for her.  Mother did call back a few hours later and said she had spoken with the couple her daughter lives with in [place], a Mr. & Mrs. Vedia Coffer, and they confirmed that patient is welcome back in the home and still has her own key.  Mother stated she can make sure patient has money to get [place] bus depot to the home, but she will not be able to pay the full $70 for the bus ticket until she gets paid in 5 days.  CSW explained she does not meet criteria to stay in the hospital that long.  It was determined that it would be best to speak directly with Mr/Mrs Black, and mother was informed of this.  She was to have them call CSW directly, but by the end of the day this had not occurred.  All information is being left for weekday CSW to follow up.  Patient initially said she would live with her friend Lawanna Kobus, then said perhaps she could stay with her mother in [place] for a week then return to her own [address] apartment in [place]. Mother then revealed this is in a home with a couple, Mr. & Mrs. Vedia Coffer, where she lived prior to coming to [place] impulsively.  Mother stated there are no firearms and no violence in the home.  She stated she sees a therapist at [facility] ([address] location) and a doctor at [facility].  Ambrose Mantle, LCSW 12/30/2018, 3:26 PM

## 2018-12-30 NOTE — Progress Notes (Signed)
Psychoeducational Group Note  Date:  12/30/2018 Time:  2030  Group Topic/Focus:  wrap up group  Participation Level: Did Not Attend  Participation Quality:  Not Applicable  Affect:  Not Applicable  Cognitive:  Not Applicable  Insight:  Not Applicable  Engagement in Group: Not Applicable  Additional Comments:  Pt remained in bed during group time.   Marcille Buffy 12/30/2018, 9:55 PM

## 2018-12-30 NOTE — Progress Notes (Signed)
D: Pt was in bed in her room upon initial approach.  Pt presents with appropriate affect and mood.  She reports her day has been "good" and her goal is to "just relax."  She is loud and attention-seeking at times, but easily redirected.  Pt denies SI/HI, denies hallucinations, denies pain.  Pt stayed in her room for the majority of the evening.  She was seen in the dayroom watching television after group.  A: Introduced self to pt.  Met with pt 1:1.  Actively listened to pt and offered support and encouragement.  Medication administered per order.  PRN medication administered for sleep.  Q15 minute safety checks maintained.  R: Pt is safe on the unit.  Pt is compliant with medications.  Pt verbally contracts for safety.  Will continue to monitor and assess.

## 2018-12-31 MED ORDER — SPIRONOLACTONE 50 MG PO TABS: 50.0000 mg | ORAL_TABLET | Freq: Two times a day (BID) | ORAL | 0 refills | Status: AC

## 2018-12-31 MED ORDER — SERTRALINE HCL 50 MG PO TABS: 50.0000 mg | ORAL_TABLET | Freq: Every day | ORAL | 0 refills | Status: AC

## 2018-12-31 MED ORDER — TRAZODONE HCL 50 MG PO TABS: 50.0000 mg | ORAL_TABLET | Freq: Every evening | ORAL | 0 refills | Status: AC | PRN

## 2018-12-31 MED ORDER — HYDROXYZINE HCL 25 MG PO TABS: 25.0000 mg | ORAL_TABLET | Freq: Three times a day (TID) | ORAL | 0 refills | Status: AC | PRN

## 2018-12-31 MED ORDER — LEVETIRACETAM 500 MG PO TABS: 500.0000 mg | ORAL_TABLET | Freq: Two times a day (BID) | ORAL | 0 refills | Status: AC

## 2018-12-31 MED ORDER — ARIPIPRAZOLE 10 MG PO TABS: 10.0000 mg | ORAL_TABLET | Freq: Every day | ORAL | 0 refills | Status: AC

## 2018-12-31 NOTE — Plan of Care (Signed)
Patient was anxious upon approach this morning. Denies SI HI AVH. Denies physical pain. Patient was somewhat irritable this morning due to confusion with transportation to get back home in [place]. MD and CSW is hoping to discharge patient tomorrow morning to catch the bus.  Patient is compliant with 15 minute checks as well as environmental checks.   Problem: Education: Goal: Emotional status will improve Outcome: Progressing Goal: Mental status will improve Outcome: Progressing Goal: Verbalization of understanding the information provided will improve Outcome: Progressing

## 2018-12-31 NOTE — Progress Notes (Signed)
CSW met with patient at bedside to discuss discharge plans. Patient shares that she is not allowed to stay with her mother in [place] and has been aware of this. Patient's plan was to get a bus ticket to [place] and stay at a shelter or go to a house where a man she used to date (who died) was renting. Patient shares she has a key to this apartment and gets mail there, but she is not on the lease. She has not been to this apartment in at least 3 months, nor has her deceased former boyfriend. CSW asked, "since no one has been paying the rent, how do you know there isn't a new tenant? How do you know the locks have not been changed?" Patient states this has not occurred to her and she doesn't want to think about it.  Patient is irritiable. She states if social worker isn't going to give her a bus ticket to [place], she wants to discharge immediately. "I'm just gonna go sell my ass on [place], to get money for a ticket. That's nothing, who doesn't want to get their hands on a tranny?!"  CSW and psychiatry spoke with patient together, the conversation was essentially the same. Patient has no one she can stay with that CSW can verify, patient has no money of her own for a bus ticket, patient is not able to identify a support who would be willing to buy her a ticket, patient reports she is established with care providers (therapy and medication management) in [place], and patient wants to discharge immediately if hospital staff does not buy her bus ticket today.  CSW attempting to follow up with social work Optician, dispensing.  Stephanie Acre, LCSW-A Clinical Social Worker

## 2018-12-31 NOTE — Tx Team (Signed)
Interdisciplinary Treatment and Diagnostic Plan Update  12/31/2018 Time of Session: 10:00am  Zachary Mcclain MRN: [id number]  Principal Diagnosis: Severe recurrent major depression without psychotic features (HCC)  Secondary Diagnoses: Principal Problem:   Severe recurrent major depression without psychotic features (HCC) Active Problems:   Borderline personality disorder (HCC)   Current Medications:  Current Facility-Administered Medications  Medication Dose Route Frequency Provider Last Rate Last Dose  . acetaminophen (TYLENOL) tablet 650 mg  650 mg Oral Q6H PRN Nira Conn A, NP      . alum & mag hydroxide-simeth (MAALOX/MYLANTA) 200-200-20 MG/5ML suspension 30 mL  30 mL Oral Q4H PRN Nira Conn A, NP      . ARIPiprazole (ABILIFY) tablet 10 mg  10 mg Oral QHS Money, Travis B, FNP   10 mg at 12/30/18 2112  . hydrOXYzine (ATARAX/VISTARIL) tablet 25 mg  25 mg Oral TID PRN Jackelyn Poling, NP      . levETIRAcetam (KEPPRA) tablet 500 mg  500 mg Oral BID Nira Conn A, NP   500 mg at 12/31/18 0752  . magnesium hydroxide (MILK OF MAGNESIA) suspension 30 mL  30 mL Oral Daily PRN Nira Conn A, NP      . multivitamin with minerals tablet 1 tablet  1 tablet Oral Daily Nira Conn A, NP      . sertraline (ZOLOFT) tablet 50 mg  50 mg Oral Daily Money, Travis B, FNP   50 mg at 12/31/18 0751  . spironolactone (ALDACTONE) tablet 50 mg  50 mg Oral BID Nira Conn A, NP   50 mg at 12/31/18 0751  . traZODone (DESYREL) tablet 50 mg  50 mg Oral QHS PRN Jackelyn Poling, NP   50 mg at 12/30/18 2112   PTA Medications: Medications Prior to Admission  Medication Sig Dispense Refill Last Dose  . ARIPiprazole (ABILIFY) 10 MG tablet Take 1 tablet (10 mg total) by mouth at bedtime. For mood control 30 tablet 0 10/2018 at [facility]  . aspirin EC 325 MG tablet Take 325 mg by mouth as needed (for migraines).    10/2018 at [facility]  . azithromycin (ZITHROMAX) 250 MG tablet Take 1 tablet (250 mg total) by mouth daily. Take first  2 tablets together, then 1 every day until finished. (Patient not taking: Reported on 12/28/2018) 6 tablet 0 Not Taking at Unknown time  . benzonatate (TESSALON) 100 MG capsule Take 1 capsule (100 mg total) by mouth every 8 (eight) hours. (Patient not taking: Reported on 12/28/2018) 21 capsule 0 Not Taking at Unknown time  . estradiol (ESTRACE) 1 MG tablet Take 2 tablets (2 mg total) by mouth 3 (three) times daily. Hormonal therapy 30 tablet 0 10/2018 at [facility]  . estrogens-methylTEST (ESTRATEST) 1.25-2.5 MG tablet Take 2 tablets by mouth daily.    10/2018 at [facility]  . hydrOXYzine (ATARAX/VISTARIL) 25 MG tablet Take 1 tablet (25 mg total) by mouth 3 (three) times daily as needed for anxiety. 60 tablet 0 10/2018 at [facility]  . levETIRAcetam (KEPPRA) 500 MG tablet Take 1 tablet (500 mg total) by mouth 2 (two) times daily. 30 tablet 0 12/14/2018 at Unknown time  . levETIRAcetam (KEPPRA) 500 MG tablet Take 1 tablet (500 mg total) by mouth 2 (two) times daily. (Patient not taking: Reported on 12/28/2018) 30 tablet 0 Not Taking at Unknown time  . Multiple Vitamin (MULTIVITAMIN WITH MINERALS) TABS tablet Take 1 tablet by mouth daily. ([place] buy from over the counter): Vitamin supplement (Patient not taking: Reported on 12/28/2018)  Not Taking at Unknown time  . sertraline (ZOLOFT) 50 MG tablet Take 1 tablet (50 mg total) by mouth daily. For depression 30 tablet 0 10/2018 at [facility]  . spironolactone (ALDACTONE) 50 MG tablet Take 1 tablet (50 mg total) by mouth 2 (two) times daily. For transgenderism 20 tablet 0 10/2018 at [facility]  . traZODone (DESYREL) 50 MG tablet Take 1 tablet (50 mg total) by mouth at bedtime as needed for sleep. 30 tablet 0 10/2018 at [facility]    Patient Stressors: Financial difficulties Marital or family conflict Medication change or noncompliance Substance abuse Other: break up with boyfriend  Patient Strengths: Ability for insight Average or above average intelligence Communication skills General fund of  knowledge  Treatment Modalities: Medication Management, Group therapy, Case management,  1 to 1 session with clinician, Psychoeducation, Recreational therapy.   Physician Treatment Plan for Primary Diagnosis: Severe recurrent major depression without psychotic features (HCC) Long Term Goal(s): Improvement in symptoms so as ready for discharge Improvement in symptoms so as ready for discharge   Short Term Goals: Ability to identify changes in lifestyle to reduce recurrence of condition will improve Ability to verbalize feelings will improve Ability to disclose and discuss suicidal ideas Ability to demonstrate self-control will improve Ability to identify and develop effective coping behaviors will improve Ability to maintain clinical measurements within normal limits will improve Compliance with prescribed medications will improve Ability to identify triggers associated with substance abuse/mental health issues will improve  Medication Management: Evaluate patient's response, side effects, and tolerance of medication regimen.  Therapeutic Interventions: 1 to 1 sessions, Unit Group sessions and Medication administration.  Evaluation of Outcomes: Progressing  Physician Treatment Plan for Secondary Diagnosis: Principal Problem:   Severe recurrent major depression without psychotic features (HCC) Active Problems:   Borderline personality disorder (HCC)  Long Term Goal(s): Improvement in symptoms so as ready for discharge Improvement in symptoms so as ready for discharge   Short Term Goals: Ability to identify changes in lifestyle to reduce recurrence of condition will improve Ability to verbalize feelings will improve Ability to disclose and discuss suicidal ideas Ability to demonstrate self-control will improve Ability to identify and develop effective coping behaviors will improve Ability to maintain clinical measurements within normal limits will improve Compliance with  prescribed medications will improve Ability to identify triggers associated with substance abuse/mental health issues will improve     Medication Management: Evaluate patient's response, side effects, and tolerance of medication regimen.  Therapeutic Interventions: 1 to 1 sessions, Unit Group sessions and Medication administration.  Evaluation of Outcomes: Progressing   RN Treatment Plan for Primary Diagnosis: Severe recurrent major depression without psychotic features (HCC) Long Term Goal(s): Knowledge of disease and therapeutic regimen to maintain health will improve  Short Term Goals: Ability to participate in decision making will improve, Ability to identify and develop effective coping behaviors will improve and Compliance with prescribed medications will improve  Medication Management: RN will administer medications as ordered by provider, will assess and evaluate patient's response and provide education to patient for prescribed medication. RN will report any adverse and/or side effects to prescribing provider.  Therapeutic Interventions: 1 on 1 counseling sessions, Psychoeducation, Medication administration, Evaluate responses to treatment, Monitor vital signs and CBGs as ordered, Perform/monitor CIWA, COWS, AIMS and Fall Risk screenings as ordered, Perform wound care treatments as ordered.  Evaluation of Outcomes: Progressing   LCSW Treatment Plan for Primary Diagnosis: Severe recurrent major depression without psychotic features (HCC) Long Term Goal(s): Safe transition to  appropriate next level of care at discharge, Engage patient in therapeutic group addressing interpersonal concerns.  Short Term Goals: Engage patient in aftercare planning with referrals and resources, Increase social support, Identify triggers associated with mental health/substance abuse issues and Increase skills for wellness and recovery  Therapeutic Interventions: Assess for all discharge needs, 1 to 1  time with Social worker, Explore available resources and support systems, Assess for adequacy in community support network, Educate family and significant other(s) on suicide prevention, Complete Psychosocial Assessment, Interpersonal group therapy.  Evaluation of Outcomes: Progressing   Progress in Treatment: Attending groups: No. Participating in groups: No. Taking medication as prescribed: Yes. Toleration medication: Yes. Family/Significant other contact made: Yes, individual(s) contacted:  mother. Patient understands diagnosis: Yes. Discussing patient identified problems/goals with staff: Yes. Medical problems stabilized or resolved: Yes. Denies suicidal/homicidal ideation: Yes. Issues/concerns per patient self-inventory: Yes.  New problem(s) identified: Yes, Describe:  homeless, no income, limited supports  New Short Term/Long Term Goal(s): detox, medication management for mood stabilization; elimination of SI thoughts; development of comprehensive mental wellness/sobriety plan.  Patient Goals: Wants to go to [place] and follow up with her providers there  Discharge Plan or Barriers: CSW continuing to assess, please see progress notes for more detailed information.  Reason for Continuation of Hospitalization: Anxiety Depression  Estimated Length of Stay: 1-3 days  Attendees: Patient: Zachary Mcclain 12/31/2018 10:34 AM  Physician:  12/31/2018 10:34 AM  Nursing:  12/31/2018 10:34 AM  RN Care Manager: 12/31/2018 10:34 AM  Social Worker: Enid Cutter, LCSWA 12/31/2018 10:34 AM  Recreational Therapist:  12/31/2018 10:34 AM  Other:  12/31/2018 10:34 AM  Other:  12/31/2018 10:34 AM  Other: 12/31/2018 10:34 AM    Scribe for Treatment Team: Darreld Mclean, LCSWA 12/31/2018 10:34 AM

## 2018-12-31 NOTE — Progress Notes (Signed)
Recreation Therapy Notes  Date:  4.6.20 Time: 0930 Location: [address] Dayroom  Group Topic: Stress Management  Goal Area(s) Addresses:  Patient will identify positive stress management techniques. Patient will identify benefits of using stress management post d/c.  Intervention:  Stress Management  Activity :  Guided Imagery.  LRT introduced the stress management technique of guided imagery.  LRT read a script that guided patients in envisioning their peaceful place.  Patients were to listen and follow along as script was read to engage in activity.    Education:  Stress Management, Discharge Planning.   Education Outcome: Acknowledges Education  Clinical Observations/Feedback:  Pt did not attend group.    Caroll Rancher, LRT/CTRS         Caroll Rancher A 12/31/2018 10:41 AM

## 2018-12-31 NOTE — Progress Notes (Signed)
[facility] leadership was able to provide patient with a [employer] bus ticket, departing the [facility] depot tomorrow morning at 8:10am. Patient will need to leave [facility] by 7:00am tomorrow morning.  Bus ticket provided to patient and additional copy on patient's chart.  Enid Cutter, LCSW-A Clinical Social Worker
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
## 2018-12-31 NOTE — Progress Notes (Signed)
Psychoeducational Group Note  Date:  12/31/2018 Time:  2328  Group Topic/Focus:  Wrap-Up Group:   The focus of this group is to help patients review their daily goal of treatment and discuss progress on daily workbooks.  Participation Level: Did Not Attend  Participation Quality:  Not Applicable  Affect:  Not Applicable  Cognitive:  Not Applicable  Insight:  Not Applicable  Engagement in Group: Not Applicable  Additional Comments: The patient did not attend group since she was asleep in her bedroom.   Hazle Coca S 12/31/2018, 11:28 PM
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
## 2018-12-31 NOTE — Progress Notes (Signed)
Patient wants to return to [place], where she impulsively moved to [place] from within the last 2 months. Weekend CSW discussed this with patient and patient's mother. Patient's mother reports that patient is allowed to return to her home, where she resides with the homeowners "Mr. And Mrs.Black." Social Work Chiropodist is agreeable to financially assisting with a [employer] if CSW can speak with the homeowners, to ensure that patient will not be homeless in a new city. A request to speak with the homeowners was made yesterday, CSW has not received a call or voicemail from either party.   CSW attempted to meet with patient to discuss discharge plans.  Patient was sleeping soundly and did not respond to CSW knocking on the door or calling patient's name "Zachary Mcclain," several times. CSW will follow up with patient later in an attempt to reach the other parties involved.  Enid Cutter, LCSW-A Clinical Social Worker

## 2019-01-01 NOTE — Progress Notes (Signed)
Patient verbalizes readiness for discharge. Follow up plan explained, AVS, transition record and SRA given along with prescriptions. Sample meds provided. All belongings returned. Refused completion of Suicide Safety Plan. Patient verbalizes understanding. Denies SI/HI and assures this Clinical research associate she will seek assistance should that change. Patient discharged ambulatory and in stable condition with bus passes.

## 2019-01-01 NOTE — Progress Notes (Signed)
D: Patient observed resting in bed most of the evening. Paitent became angry and agitated when MHT on hall returned her cleaned laundry to her room which she states disturbed her. Patient states, "I'm ready to go in the morning. I want to get up very early so I can shower and fix my hair. I also want to go to the bus cafe before I board to leave for [place]." Patient's affect animated, mood labile. Denies pain, physical complaints.   A: Medicated per orders, prn trazadone given for sleep. Medication education provided. Level III obs in place for safety. Emotional support offered. Patient encouraged to complete Suicide Safety Plan before discharge. Encouraged to attend and participate in unit programming.  Fall prevention plan in place and reviewed with patient as pt is a high fall risk due to seizure disorder.   R: Patient verbalizes understanding of POC, falls prevention education. On reassess, patient was asleep. Patient denies SI/HI/AVH and remains safe on level III obs. Will continue to monitor throughout the night.

## 2020-03-23 IMAGING — CR DG CHEST 2V
2 series · 2 of 2 positions shown · non-contrast
Comparison: None.

CLINICAL DATA: Productive cough for 2 weeks.

EXAM:
CHEST - 2 VIEW

[w chest pa]
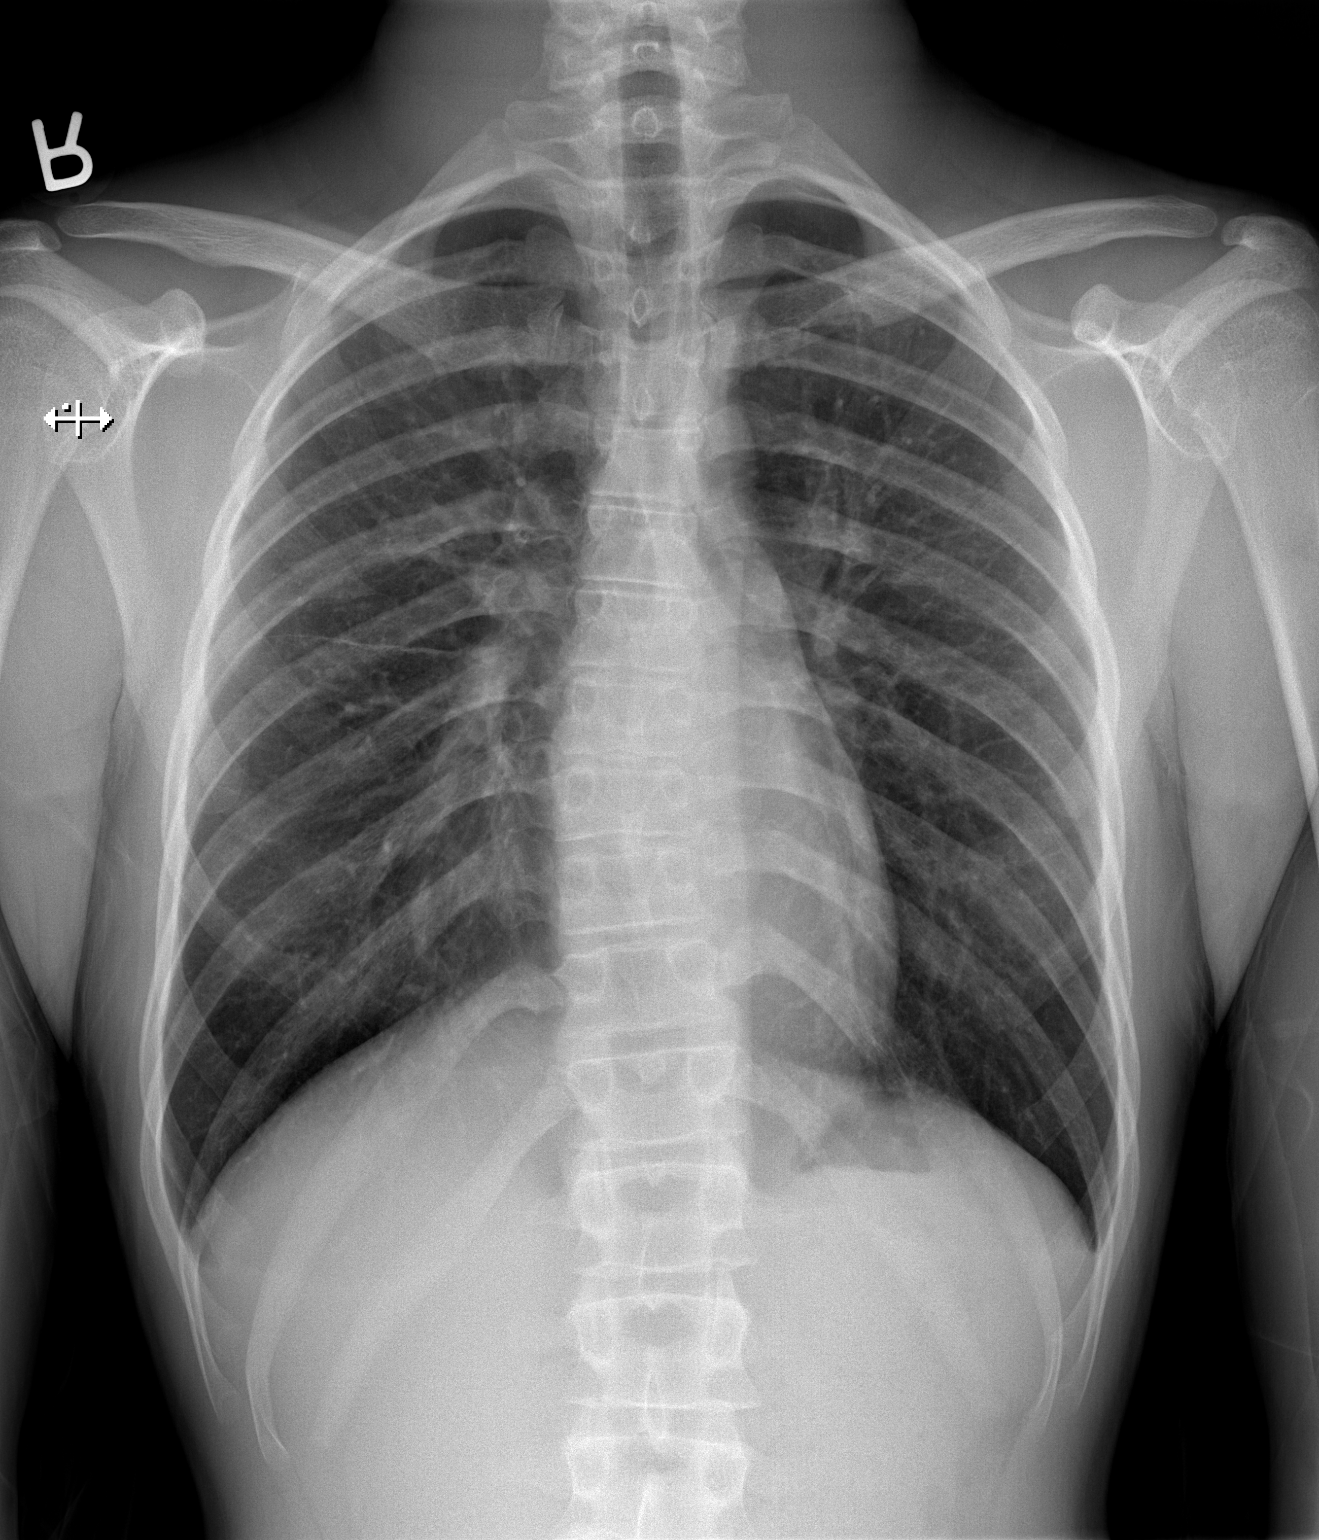

[w chest lat]
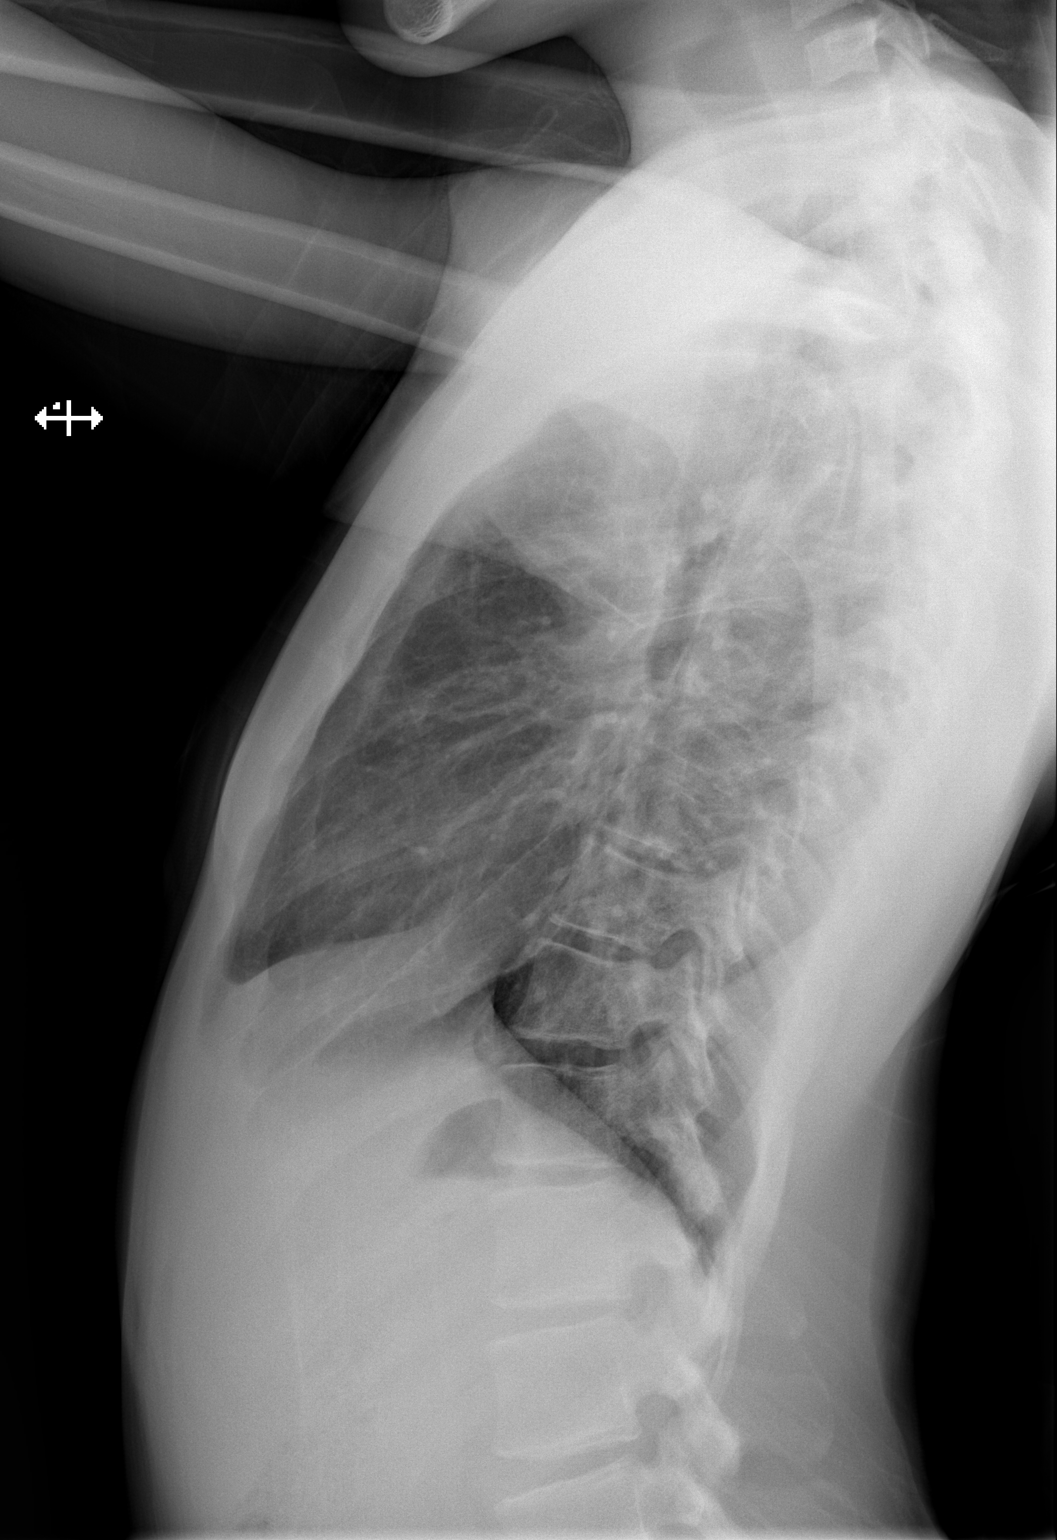

[2 of 2 positions shown; findings below may reference images not displayed]

FINDINGS: The heart size and mediastinal contours are within normal limits.
Both lungs are clear. The visualized skeletal structures are
unremarkable.
IMPRESSION: No active cardiopulmonary disease.
